# Patient Record
Sex: Male | Born: 1955 | Race: Black or African American | Hispanic: No | Marital: Single | Smoking: Current every day smoker
Health system: Southern US, Community
[De-identification: ages and names within clinical notes are randomized; demographics above are authoritative.]

## PROBLEM LIST (undated history)

## (undated) DIAGNOSIS — E785 Hyperlipidemia, unspecified: Secondary | ICD-10-CM

## (undated) HISTORY — PX: COLONOSCOPY: SHX174

## (undated) HISTORY — PX: HERNIA REPAIR: SHX51

## (undated) HISTORY — DX: Hyperlipidemia, unspecified: E78.5

---

## 2009-06-02 ENCOUNTER — Inpatient Hospital Stay (HOSPITAL_COMMUNITY): Admission: EM | Admit: 2009-06-02 | Discharge: 2009-06-04 | Payer: Self-pay | Admitting: Emergency Medicine

## 2009-07-05 ENCOUNTER — Ambulatory Visit: Payer: Self-pay | Admitting: Internal Medicine

## 2009-07-05 LAB — CONVERTED CEMR LAB
ALT: 16 units/L (ref 0–53)
AST: 25 units/L (ref 0–37)
BUN: 9 mg/dL (ref 6–23)
Basophils Relative: 1 % (ref 0.0–3.0)
Chloride: 107 meq/L (ref 96–112)
Cholesterol: 179 mg/dL (ref 0–200)
Eosinophils Relative: 1 % (ref 0.0–5.0)
HCT: 41.6 % (ref 39.0–52.0)
Hemoglobin, Urine: NEGATIVE
Hemoglobin: 14.1 g/dL (ref 13.0–17.0)
Leukocytes, UA: NEGATIVE
Lymphs Abs: 2.2 10*3/uL (ref 0.7–4.0)
MCV: 93.5 fL (ref 78.0–100.0)
Monocytes Absolute: 0.6 10*3/uL (ref 0.1–1.0)
Neutro Abs: 6.8 10*3/uL (ref 1.4–7.7)
Nitrite: NEGATIVE
Potassium: 4.1 meq/L (ref 3.5–5.1)
RBC: 4.45 M/uL (ref 4.22–5.81)
Sodium: 141 meq/L (ref 135–145)
TSH: 0.65 microintl units/mL (ref 0.35–5.50)
Total Protein, Urine: NEGATIVE mg/dL
Total Protein: 6.9 g/dL (ref 6.0–8.3)
Triglycerides: 63 mg/dL (ref 0.0–149.0)
WBC: 9.8 10*3/uL (ref 4.5–10.5)
pH: 5 (ref 5.0–8.0)

## 2009-07-29 ENCOUNTER — Ambulatory Visit: Payer: Self-pay | Admitting: Gastroenterology

## 2009-08-14 ENCOUNTER — Encounter: Payer: Self-pay | Admitting: Gastroenterology

## 2009-08-14 ENCOUNTER — Ambulatory Visit: Payer: Self-pay | Admitting: Gastroenterology

## 2009-08-16 ENCOUNTER — Encounter: Payer: Self-pay | Admitting: Gastroenterology

## 2011-02-06 IMAGING — CR DG CHEST 1V PORT
1 series · 1 of 1 positions shown · non-contrast
Comparison: None

CLINICAL DATA: Small bowel obstruction, vomiting and incarcerated
inguinal hernia.  Preoperative respiratory exam.

PORTABLE CHEST - 1 VIEW

[view not recorded]
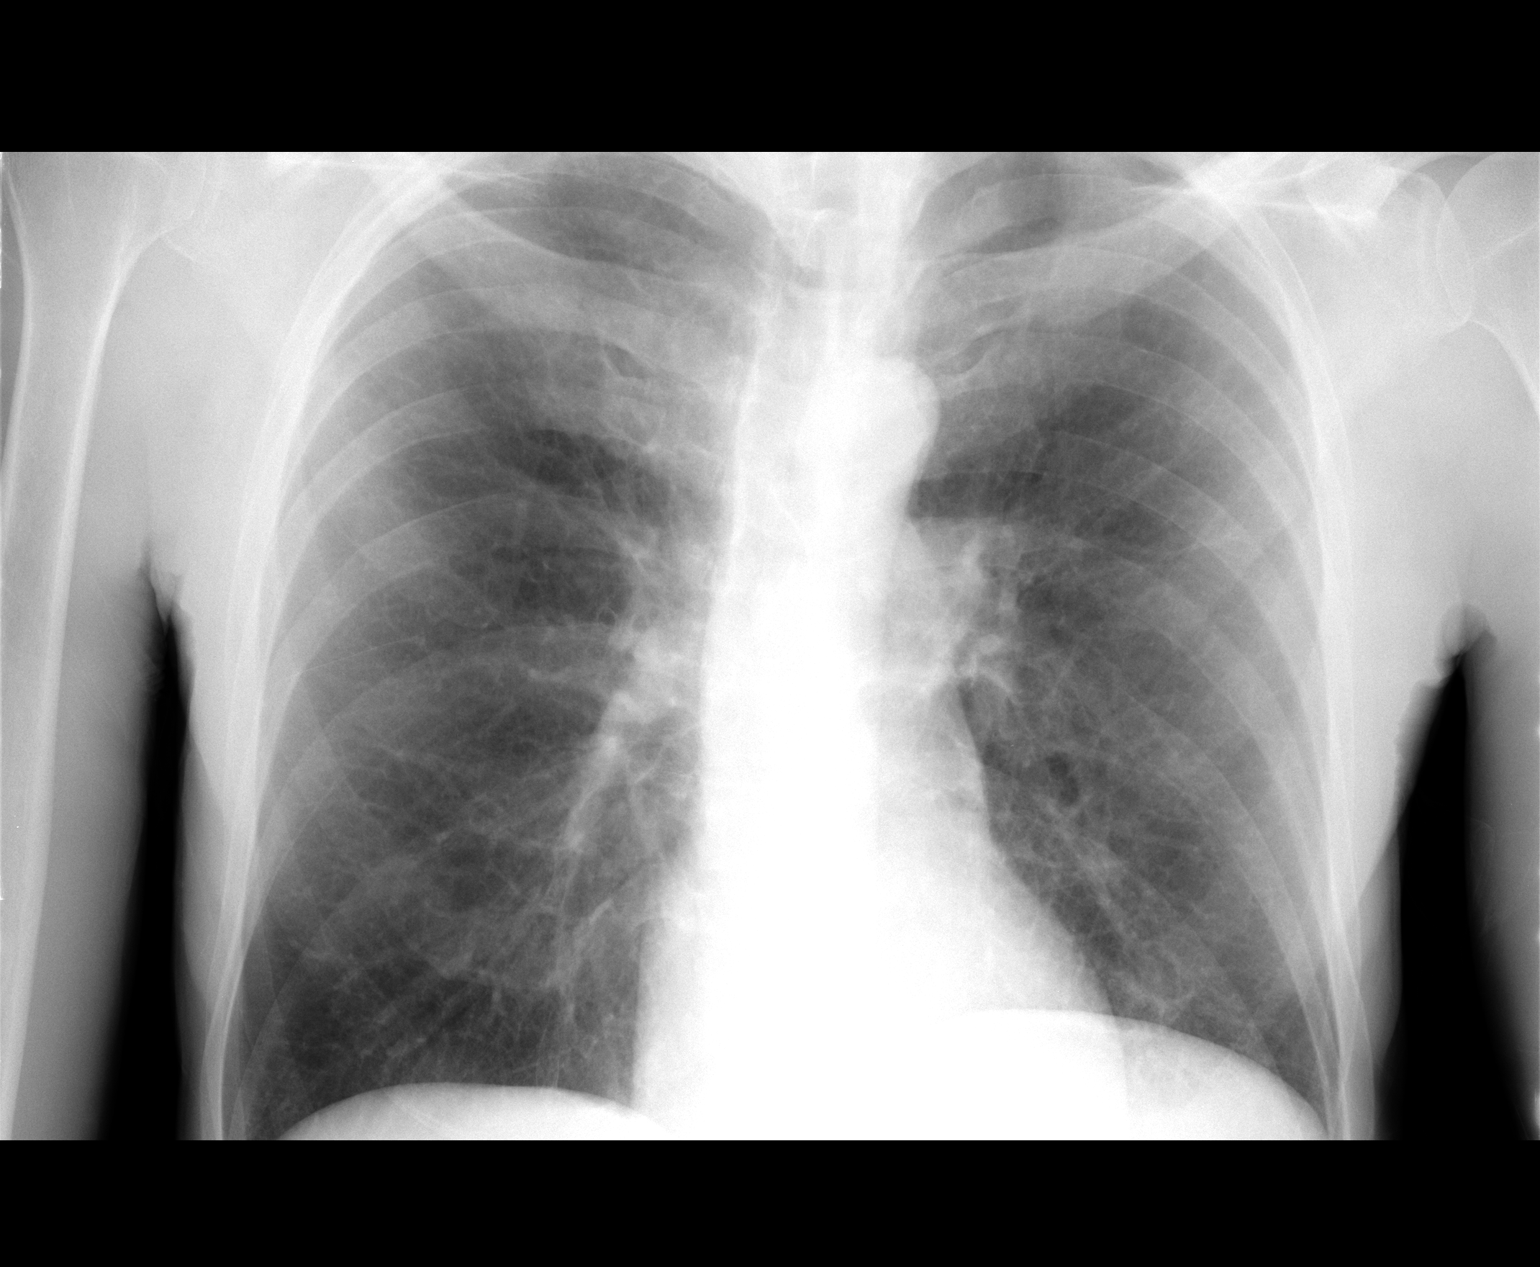

[1 of 1 positions shown; findings below may reference images not displayed]

FINDINGS: Lungs show evidence of COPD.  There is mild elevation of
the left hemidiaphragm.  No edema, infiltrate or pleural fluid
identified.  Heart size is normal.
IMPRESSION: COPD.  No active disease.

## 2011-04-06 LAB — URINALYSIS, ROUTINE W REFLEX MICROSCOPIC
Ketones, ur: >80 mg/dL — AB
Nitrite: NEGATIVE
Specific Gravity, Urine: 1.035 — ABNORMAL HIGH (ref 1.005–1.030)
Urobilinogen, UA: 2 mg/dL — ABNORMAL HIGH (ref 0.0–1.0)
pH: 6.5 (ref 5.0–8.0)

## 2011-04-06 LAB — CBC
HCT: 36.6 % — ABNORMAL LOW (ref 39.0–52.0)
Hemoglobin: 12.4 g/dL — ABNORMAL LOW (ref 13.0–17.0)
Hemoglobin: 15.4 g/dL (ref 13.0–17.0)
MCHC: 32.6 g/dL (ref 30.0–36.0)
MCV: 92.7 fL (ref 78.0–100.0)
Platelets: 218 10*3/uL (ref 150–400)
RDW: 12.7 % (ref 11.5–15.5)
RDW: 12.7 % (ref 11.5–15.5)

## 2011-04-06 LAB — COMPREHENSIVE METABOLIC PANEL
ALT: 25 U/L (ref 0–53)
AST: 36 U/L (ref 0–37)
Albumin: 4.4 g/dL (ref 3.5–5.2)
Alkaline Phosphatase: 69 U/L (ref 39–117)
Calcium: 9.7 mg/dL (ref 8.4–10.5)
GFR calc Af Amer: >60 mL/min (ref >60)
Glucose, Bld: 137 mg/dL — ABNORMAL HIGH (ref 70–99)
Potassium: 3.9 mEq/L (ref 3.5–5.1)
Sodium: 138 mEq/L (ref 135–145)
Total Protein: 8 g/dL (ref 6.0–8.3)

## 2011-04-06 LAB — DIFFERENTIAL
Basophils Relative: 0 % (ref 0–1)
Eosinophils Absolute: 0 10*3/uL (ref 0.0–0.7)
Eosinophils Relative: 0 % (ref 0–5)
Lymphs Abs: 0.8 10*3/uL (ref 0.7–4.0)
Monocytes Absolute: 0.4 10*3/uL (ref 0.1–1.0)
Neutro Abs: 18.2 10*3/uL — ABNORMAL HIGH (ref 1.7–7.7)

## 2011-04-06 LAB — LIPID PANEL
Cholesterol: 148 mg/dL (ref 0–200)
HDL: 39 mg/dL — ABNORMAL LOW (ref >39)
Triglycerides: 34 mg/dL (ref <150)

## 2011-04-06 LAB — LIPASE, BLOOD: Lipase: 12 U/L (ref 11–59)

## 2011-04-06 LAB — TSH: TSH: 0.203 u[IU]/mL — ABNORMAL LOW (ref 0.350–4.500)

## 2011-04-06 LAB — CREATININE, SERUM: GFR calc non Af Amer: >60 mL/min (ref >60)

## 2011-04-06 LAB — POTASSIUM: Potassium: 4.7 mEq/L (ref 3.5–5.1)

## 2011-05-12 NOTE — H&P (Signed)
NAMEABDULKARIM, Charles Bates NO.:  [id number]   MEDICAL RECORD NO.:  [id number]          PATIENT TYPE:  EMS   LOCATION:  ED                           FACILITY:  [facility]   PHYSICIAN:  Ardeth Sportsman, MD     DATE OF BIRTH:  1956/11/14   DATE OF ADMISSION:  06/02/2009  DATE OF DISCHARGE:                              HISTORY & PHYSICAL   REASON FOR CONSULTATION AND ADMISSION:  Small bowel obstruction  secondary to incarcerated right inguinal hernia.   HISTORY OF THE PRESENT ILLNESS:  The patient is a 55 year old smoker who  is otherwise healthy and does not see physicians.  He normally has  pretty good exercise tolerance. He has never had any history of hernias  or prior surgeries that he can recall.  However, yesterday, he started  having some nausea.  It intensified to the point that he vomited.  He  came to the emergency room.  He was found to have a lump in his right  groin that was not particularly large nor particularly tender, however,  workup was done and he has evidence of bowel obstruction by CAT scan  with a transition point in his right inguinal canal with ileum within  it.  Based on concern surgical consultation was requested.  The patient  denies any sick contacts or travel history.  He is obstipated with no  flatus or bowel movement today.  He normally has a bowel movement about  every day or every other day.  He has some minor cough.  There has been  no recent bronchitis.  No issues of significant dysuria.  I think he  does moderate lifting and activity.   PAST MEDICAL HISTORY:  Tobacco abuse.   PAST SURGICAL HISTORY:  Negative.   MEDICATIONS:  None.   ALLERGIES:  None.   SOCIAL HISTORY:  Positive for tobacco but he denies any alcohol or any  other drug use.  He is not married.  His mother is here at his side.   FAMILY HISTORY:  He cannot recall any major GI issues such as colon  cancer or polyps or Meckel's diverticulum.  No history of inflammatory  bowel disease.  No early cardiopulmonary history.   REVIEW OF SYSTEMS:  Review of systems is noted per history of present  illness.  In general he says his weight has been stable but his mother  feels like he has lot a little bit of weight over the past few years.  No fevers, chills or sweats.  Eyes:  Negative.  HEENT:  Negative.  Cardiac and respiratory:  Negative.  He can walk a couple of miles  without much difficulty.  GI:  No dysphagia to solids or liquids.  No  hematochezia.  No hematemesis.  No hematuria.  No heart burn or reflux.  GU:  No dysuria, pyuria, hematuria, no straining with urination.  No  prostatism.  Musculoskeletal, dermatologic, neurologic, psychiatric,  heme/lymph, allergic are otherwise negative.   PHYSICAL EXAMINATION:  VITALS SIGNS:  Temperature is 100.8 as his  temperature maximum.  Pulse in the 70s, respirations 16, blood pressure  115/66.  He had 3/10 abdominal and groin pain.  GENERAL:  On physical examination he is a well-developed, thin, mildly  cachectic male in no acute distress.  PSYCHIATRIC:  He is pleasant and interactive with at least average  intelligence.  No evidence of any dementia, psychosis or paranoia.  HEENT:  Eyes:  Pupils are equal, round, and reactive to light.  His  sclerae are slightly injected although no evidence of any icterus.  Extraocular movements are intact.  NECK:  Neck is supple without any masses.  Trachea is midline.  LYMPH:  No head, neck, axillary, or groin lymphadenopathy.  HEENT:  Normocephalic.  The mucous membranes are dry.  The nasopharynx  and oropharynx are clear.  HEART:  Regular rate and rhythm.  No murmurs, gallops or rubs.  CHEST:  Clear to auscultation bilaterally.  No wheezes, rales or  rhonchi.  No pain to rib or sternal compression.  ABDOMEN:  Abdomen is mildly to moderately distended but very soft.  No  umbilical hernia.  No evidence of incisions.  GENITAL:  Normal external male genitalia.  He has an  obvious 4 x 4-cm  mass in his right groin.  His testicles appear to be normal.  I feel an  impulse on the left side concerning for a spontaneously reducible left  inguinal hernia.  RECTAL:  Deferred per patient request.  MUSCULOSKELETAL:  No evidence of any clubbing, cyanosis or edema.  Normal range of motion in the shoulders, elbows and wrists as well as  hips, knees and ankles.  BREASTS:  No sores or lesions.  No nipple discharge.  No discrete  masses.  BACK:  No pain on costophrenic angles.  No pain on cervical,  thoracolumbar or sacral regions.  SKIN:  No petechiae or purpura.  He does have some moles on his face and  trunk that are unknown and few periangiomas but no evidence of any  telangiectasias or caput medusae.   LABORATORY DATA:  Blood work:  His white count is 15.4.  Urinalysis is  positive for ketones but otherwise is negative.  His electrolytes are  ok.   CT scan shows mildly dilated small bowel loops 3-4-cm in size.  He has a  transition point in his right groin with an obvious peep of small bowel  concerning for an incarcerated right inguinal hernia.  He is  decompressed distally.  He has some stool in his colon.  He has not  evidence of any other incisional hernias.   ASSESSMENT/PLAN:  A 55 year old male with bilateral inguinal hernias,  left spontaneously reducible and right incarcerated with a transition  point for small bowel obstruction.  1. Admit.  2. IV antibiotics.  3. IV fluids.  4. EKG is clear.  5. Chest x-ray.  6. Diagnostic laparoscopy, possible exploratory laparotomy with repair      of incarcerated right inguinal hernia and possible bilateral      inguinal hernia repairs.  If there is no evidence of any ischemia      and it is easily reducible I will do a laparoscopic bilateral      inguinal hernia repair with mesh.  However, if there it is necrotic      and requires bowel resection then I will focus on the right side      only and use biologic  mesh underlie.  The risks, benefits and      alternatives were discussed.  Questions were answered and the  patient understands the risk of surgery is far less than the risk      of doing nothing and he and his mother agreed to proceed.  I have      discussed the case with Dr. Weldon Inches, the ER physician as well as      the operating room staff and they are making efforts to get this      done emergently.   He is also having NG tube currently being placed as well.  We will do a  Foley for monitoring perioperatively as well.      Ardeth Sportsman, MD  Electronically Signed     SCG/MEDQ  D:  06/02/2009  T:  06/03/2009  Job:  [id number]   cc:   Hassan Buckler. Weldon Inches, MD  Fax: [phone]

## 2011-05-12 NOTE — Op Note (Signed)
Charles Bates, Charles Bates NO.:  [id number]   MEDICAL RECORD NO.:  [id number]          PATIENT TYPE:  INP   LOCATION:  0102                         FACILITY:  [facility]   PHYSICIAN:  Ardeth Sportsman, MD     DATE OF BIRTH:  [birth year]   DATE OF PROCEDURE:  06/02/2009  DATE OF DISCHARGE:                               OPERATIVE REPORT   PRIMARY CARE PHYSICIAN:  Not available.   ER PHYSICIAN:  Tinnie Gens P. Weldon Inches, MD at [facility] emergency  department.   SURGEON:  Ardeth Sportsman, MD   ASSISTANT:  RN.   PREOPERATIVE DIAGNOSES:  1. Incarcerated right inguinal hernia.  2. Small bowel obstruction secondary to #1.  3. Question of left inguinal hernia.   POSTOPERATIVE DIAGNOSES:  1. Small bowel obstruction.  2. Right direct inguinal hernia incarcerated with the distal ileum      causing bowel obstruction.  3. Left nonincarcerated femoral hernia.  4. Left nonincarcerated obturator hernia.   PROCEDURE PERFORMED:  1. Diagnostic laparoscopy.  2. Laparoscopic reduction, right incarcerated direct inguinal hernia.  3. Laparoscopic ligation of the right obturator bleeding vein and      arterial branch.  4. Laparoscopic left femoral hernia repair.  5. Laparoscopic left obturator hernia repair.   ANESTHESIA:  1. General anesthesia.  2. Local anesthetic in field block around all port sites.  Right      ilioinguinal/genitofemoral nerve blocks.   DRAINS:  None.   ESTIMATED BLOOD LOSS:  100 mL.   COMPLICATIONS:  Bleeding from left obturator vein and arterial branch  controlled with pressure and laparoscopic clip applier.   INDICATIONS:  Mr. Charles Bates is a 55 year old male who is otherwise healthy  and does not have a regular Dr. who has a very physically active job  doing a lot of lifting and moving.  He noted yesterday he started having  nausea, vomiting and abdominal discomfort.  He also felt a lump in his  right groin.  He denies any history of prior hernias.  When he  started  throwing up his mother brought him into the emergency room.  He had a  workup concerning for possible strain and incarcerated inguinal hernia.  Neither Dr. Weldon Inches nor myself could reduce it.   Differential diagnosis was discussed.  Options were discussed,  recommendations made for diagnostic laparoscopy versus exploratory  laparotomy with reduction and repair of right inguinal hernia.  He has  possible left inguinal hernia as well.  Recommendations made for  inspection on that region with possible repair if it is safe.  Possible  need for bowel resection and or ostomy was discussed.  Natural history  of surgery discussed.  Questions answered and he and his mother agreed  to proceed.   OPERATIVE FINDINGS:  He had a knuckle of distal ileum (about a foot  proximal to the ligament of Treitz) that was incarcerated into a dilated  direct inguinal hernia.  He had no evidence of right indirect femoral or  obturator hernias.  On the left, he had no evidence of a left direct or  indirect hernia.  He had  a mildly dilated left femoral hernia and a  mildly dilated left obturator hernia as well.   DESCRIPTION OF PROCEDURE:  Informed consent was confirmed.  The patient  received IV cefoxitin just prior to surgery.  He had preoperative chest  x-ray and EKG that was otherwise clear.  He underwent general anesthesia  without any difficulty.  He had a Foley catheter sterilely placed.  He  already had a nasogastric tube placed.  He was positioned supine, both  arms tucked.  His abdomen was clipped, prepped and draped in sterile  fashion.  Surgical time out confirmed our plan.   A #5 mm  port was placed in the left upper quadrant using optical entry  technique with the patient in steep reverse Trendelenburg and left side  up.  Camera inspection revealed no intra-abdominal injury.  Under direct  visualization a 5 mm port was placed in the left flank.  Camera  inspection revealed mildly dilated  small bowel loops with a transition  point going into a direct defect.  The right groin was somewhat firm but  after careful massage and milking, was able to pop the ileum back into  the peritoneal cavity.  Laparoscopic inspection revealed some ecchymosis  on the small bowel but no evidence of any ischemia or necrosis.  Irrigation was done with clear return down in the pelvis.  The bowel was  peristalsing well.  After about 10 minutes, on inspection and re-  examination, the bowel had pinked up and looked healthy with a knuckle  serosal bruise.  Camera inspection revealed possible femoral hernia on  the left side.  Camera inspection revealed no other intra-abdominal  pathology.  The appendix looked normal.   With that, capnoperitoneum was evacuated.  I converted to a  preperitoneal approach and a #12 mm Hasson port was placed  infraumbilically using a Hasson technique and the  preperitoneal/retrorectal plane just left of midline.  A 10 mm/30 degree  scope was used to free the peritoneum off bilateral lower quadrants as  capnopreperitoneum was induced to 15 mmHg.  5 mm ports were placed on  the left and right midabdomen.   Dissection was carried onto the right side.  Peritoneum was swept off  the right flank.  The bladder was freed off the anterior medial aspects  off the pelvis.  A fairly large swath of peritoneum was going up into a  direct defect medial to the inferior epigastric vessels.  The hernia sac  was carefully skeletonized with the rest of the cord structures  including vas deferens.  I was able to reduce the hernia sac.  It looked  a little bruised but viable.  Hernia sac and peritoneum were peeled off  the cord structures as proximally as possible.  Inspection revealed no  evidence of femoral indirect or obturator defects.   Attention was turned towards the left side.  Dissection was carried out  in a mirror image fashion.  There was no evidence of any direct or  indirect  hernia defect; however, in further dissection I could see a  mildly dilated femoral ring concerning for a femoral hernia.  I  dissected in the obturator area and in freeing that off and sweeping the  obturator vein, I encountered some brisk bleeding.  I ended up placing  another 5 mm port in the right midabdomen.  After copious irrigation and  pressure, I saw that there was a tear in the obturator vein.  It was  carefully isolated  and in lifting it, it actually avulsed.  I placed two  clips on the proximal side of the obturator vein and two clips more on  the distal side.  I had to place a few more clips.  Ultimately I got  hemostasis there.  On further examination there was a branch of the  obturator artery that was oozing as well.  This was closed with clips  proximally and distally to good result.  Copious irrigation was done and  hemostasis was ensured.  This was the source of 95% of all the bleeding  during the case.  The peritoneum was peeled back proximally off the  psoas muscle as well.  Camera inspection revealed no injury to any other  vessels.  I actually could see the left ureter and it was normal and  fine.   15 x 15 cm ultra light weight polypropylene mesh was used for each side  since there was no evidence of any ischemia necrosis and he was  hemodynamically stable and there was not contamination.  Each piece of  mesh was cut to a half skull shape.  A slit was made for this half skull  shape about 6 cm/9 cm point and carried up.  Mesh was rolled up, placed  into the right preperitoneal space and position so that a medial  inferior flap that was 6 x 6 cm rested in the true pelvis between the  bladder covering up the obturator foramen well and rolling up, coming up  over the right superior pubic ramus as well.  The mesh lay well  posteriorly over the psoas region covering obturator, femoral, direct  and indirect regions.   A similar mesh was placed in the mirror image  fashion on the left side.  It covered the left obturator and femoral defects well.  It covered the  direct and indirect, the internal ring and the direct spaces well.   Copious irrigation done with clear return and there was no evidence of  any bleeding or other abnormalities.  However, there was some small tear  in the left lower quadrant peritoneum and right midabdominal peritoneum  and these  were easily closed.  I went ahead and did a high ligation on  the very dilated hernia sac on the right direct side since there was a  tear near the base as well to good result.  Mesh was held down in place  as capnopreperitoneum was evacuated.  Ports were removed.  The  infraumbilical fascial defect was closed using the 0 Vicryl interrupted  stitch x2.  Skin was closed using 4-0 Monocryl stitch.  Sterile dressing  was applied.   Patient was extubated and sent to recovery room in stable condition.  I  have discussed postoperative care with the patient's mother and friend  in detail.  Gave her my card.  Questions were answered and they  expressed understanding and appreciation.      Ardeth Sportsman, MD  Electronically Signed     SCG/MEDQ  D:  06/02/2009  T:  06/03/2009  Job:  [id number]   cc:   Hassan Buckler. Weldon Inches, MD  Fax: [phone]

## 2014-07-27 ENCOUNTER — Encounter: Payer: Self-pay | Admitting: Gastroenterology

## 2014-08-18 ENCOUNTER — Encounter: Payer: Self-pay | Admitting: Gastroenterology

## 2014-09-24 ENCOUNTER — Inpatient Hospital Stay (HOSPITAL_COMMUNITY)
Admission: EM | Admit: 2014-09-24 | Discharge: 2014-09-26 | DRG: 307 | Disposition: A | Discharge status: Home / Self Care | Payer: BC Managed Care – PPO | Attending: Internal Medicine | Admitting: Internal Medicine

## 2014-09-24 ENCOUNTER — Encounter (HOSPITAL_COMMUNITY): Payer: Self-pay | Admitting: Emergency Medicine

## 2014-09-24 DIAGNOSIS — K5900 Constipation, unspecified: Secondary | ICD-10-CM | POA: Diagnosis present

## 2014-09-24 DIAGNOSIS — IMO0002 Reserved for concepts with insufficient information to code with codable children: Secondary | ICD-10-CM | POA: Diagnosis present

## 2014-09-24 DIAGNOSIS — M545 Low back pain: Secondary | ICD-10-CM

## 2014-09-24 DIAGNOSIS — M549 Dorsalgia, unspecified: Secondary | ICD-10-CM | POA: Diagnosis not present

## 2014-09-24 DIAGNOSIS — A5202 Syphilitic aortitis: Principal | ICD-10-CM | POA: Diagnosis present

## 2014-09-24 DIAGNOSIS — F17200 Nicotine dependence, unspecified, uncomplicated: Secondary | ICD-10-CM | POA: Diagnosis present

## 2014-09-24 DIAGNOSIS — I776 Arteritis, unspecified: Secondary | ICD-10-CM

## 2014-09-24 DIAGNOSIS — R935 Abnormal findings on diagnostic imaging of other abdominal regions, including retroperitoneum: Secondary | ICD-10-CM

## 2014-09-24 DIAGNOSIS — R109 Unspecified abdominal pain: Secondary | ICD-10-CM | POA: Diagnosis present

## 2014-09-24 LAB — COMPREHENSIVE METABOLIC PANEL
ALBUMIN: 3.5 g/dL (ref 3.5–5.2)
ALK PHOS: 99 U/L (ref 39–117)
ALT: 11 U/L (ref 0–53)
AST: 16 U/L (ref 0–37)
Anion gap: 11 (ref 5–15)
BUN: 14 mg/dL (ref 6–23)
CALCIUM: 9.4 mg/dL (ref 8.4–10.5)
CO2: 26 mEq/L (ref 19–32)
Chloride: 101 mEq/L (ref 96–112)
Creatinine, Ser: 0.7 mg/dL (ref 0.50–1.35)
GFR calc Af Amer: >90 mL/min (ref >90)
GFR calc non Af Amer: >90 mL/min (ref >90)
Glucose, Bld: 108 mg/dL — ABNORMAL HIGH (ref 70–99)
POTASSIUM: 4.6 meq/L (ref 3.7–5.3)
SODIUM: 138 meq/L (ref 137–147)
TOTAL PROTEIN: 7.6 g/dL (ref 6.0–8.3)
Total Bilirubin: 0.3 mg/dL (ref 0.3–1.2)

## 2014-09-24 LAB — URINALYSIS, ROUTINE W REFLEX MICROSCOPIC
BILIRUBIN URINE: NEGATIVE
Glucose, UA: NEGATIVE mg/dL
Hgb urine dipstick: NEGATIVE
Ketones, ur: NEGATIVE mg/dL
Leukocytes, UA: NEGATIVE
NITRITE: NEGATIVE
PROTEIN: NEGATIVE mg/dL
SPECIFIC GRAVITY, URINE: 1.023 (ref 1.005–1.030)
UROBILINOGEN UA: 1 mg/dL (ref 0.0–1.0)
pH: 5.5 (ref 5.0–8.0)

## 2014-09-24 LAB — CBC WITH DIFFERENTIAL/PLATELET
BASOS ABS: 0 10*3/uL (ref 0.0–0.1)
BASOS PCT: 0 % (ref 0–1)
EOS ABS: 0.1 10*3/uL (ref 0.0–0.7)
Eosinophils Relative: 1 % (ref 0–5)
HCT: 41.5 % (ref 39.0–52.0)
Hemoglobin: 14.2 g/dL (ref 13.0–17.0)
LYMPHS ABS: 2.2 10*3/uL (ref 0.7–4.0)
Lymphocytes Relative: 20 % (ref 12–46)
MCH: 30.1 pg (ref 26.0–34.0)
MCHC: 34.2 g/dL (ref 30.0–36.0)
MCV: 88.1 fL (ref 78.0–100.0)
Monocytes Absolute: 0.8 10*3/uL (ref 0.1–1.0)
Monocytes Relative: 7 % (ref 3–12)
NEUTROS PCT: 72 % (ref 43–77)
Neutro Abs: 8.1 10*3/uL — ABNORMAL HIGH (ref 1.7–7.7)
PLATELETS: 334 10*3/uL (ref 150–400)
RBC: 4.71 MIL/uL (ref 4.22–5.81)
RDW: 12.5 % (ref 11.5–15.5)
WBC: 11.3 10*3/uL — ABNORMAL HIGH (ref 4.0–10.5)

## 2014-09-24 MED ORDER — OXYCODONE-ACETAMINOPHEN 5-325 MG PO TABS: 2.0000 | ORAL_TABLET | Freq: Once | ORAL | Status: DC

## 2014-09-24 MED ORDER — KETOROLAC TROMETHAMINE 60 MG/2ML IM SOLN: 60.0000 mg | Freq: Once | INTRAMUSCULAR | Status: DC

## 2014-09-24 NOTE — ED Notes (Signed)
Pt c/o left flank pain for about 3 weeks. Pt states that now has left groin pain for about 2 weeks. 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

## 2014-09-25 ENCOUNTER — Encounter (HOSPITAL_COMMUNITY): Payer: Self-pay

## 2014-09-25 ENCOUNTER — Emergency Department (HOSPITAL_COMMUNITY): Payer: BC Managed Care – PPO

## 2014-09-25 DIAGNOSIS — I776 Arteritis, unspecified: Secondary | ICD-10-CM

## 2014-09-25 DIAGNOSIS — R109 Unspecified abdominal pain: Secondary | ICD-10-CM | POA: Diagnosis present

## 2014-09-25 DIAGNOSIS — M545 Low back pain, unspecified: Secondary | ICD-10-CM

## 2014-09-25 DIAGNOSIS — K5900 Constipation, unspecified: Secondary | ICD-10-CM | POA: Diagnosis present

## 2014-09-25 DIAGNOSIS — R1084 Generalized abdominal pain: Secondary | ICD-10-CM

## 2014-09-25 DIAGNOSIS — M549 Dorsalgia, unspecified: Secondary | ICD-10-CM

## 2014-09-25 DIAGNOSIS — R935 Abnormal findings on diagnostic imaging of other abdominal regions, including retroperitoneum: Secondary | ICD-10-CM

## 2014-09-25 DIAGNOSIS — IMO0002 Reserved for concepts with insufficient information to code with codable children: Secondary | ICD-10-CM | POA: Diagnosis present

## 2014-09-25 DIAGNOSIS — F17200 Nicotine dependence, unspecified, uncomplicated: Secondary | ICD-10-CM | POA: Diagnosis present

## 2014-09-25 DIAGNOSIS — A5202 Syphilitic aortitis: Secondary | ICD-10-CM | POA: Diagnosis present

## 2014-09-25 LAB — SEDIMENTATION RATE: SED RATE: 20 mm/h — AB (ref 0–16)

## 2014-09-25 LAB — HIV ANTIBODY (ROUTINE TESTING W REFLEX): HIV: NONREACTIVE

## 2014-09-25 LAB — C-REACTIVE PROTEIN: CRP: 0.7 mg/dL — AB (ref <0.60)

## 2014-09-25 LAB — RPR

## 2014-09-25 MED ORDER — IOHEXOL 300 MG/ML  SOLN
100.0000 mL | Freq: Once | INTRAMUSCULAR | Status: AC | PRN
  Administered 2014-09-25: 100 mL via INTRAVENOUS

## 2014-09-25 MED ORDER — MORPHINE SULFATE 4 MG/ML IJ SOLN
4.0000 mg | INTRAMUSCULAR | Status: DC | PRN
  Administered 2014-09-25: 4 mg via INTRAVENOUS
  Filled 2014-09-25 (×2): qty 1

## 2014-09-25 MED ORDER — HEPARIN SODIUM (PORCINE) 5000 UNIT/ML IJ SOLN
5000.0000 [IU] | Freq: Three times a day (TID) | INTRAMUSCULAR | Status: DC
  Administered 2014-09-25 – 2014-09-26 (×4): 5000 [IU] via SUBCUTANEOUS
  Filled 2014-09-25 (×10): qty 1

## 2014-09-25 MED ORDER — KETOROLAC TROMETHAMINE 30 MG/ML IJ SOLN
30.0000 mg | Freq: Once | INTRAMUSCULAR | Status: AC
  Administered 2014-09-25: 30 mg via INTRAVENOUS
  Filled 2014-09-25: qty 1

## 2014-09-25 MED ORDER — DOCUSATE SODIUM 100 MG PO CAPS
100.0000 mg | ORAL_CAPSULE | Freq: Two times a day (BID) | ORAL | Status: DC
  Administered 2014-09-25 – 2014-09-26 (×3): 100 mg via ORAL
  Filled 2014-09-25 (×2): qty 1

## 2014-09-25 MED ORDER — IOHEXOL 300 MG/ML  SOLN
50.0000 mL | Freq: Once | INTRAMUSCULAR | Status: AC | PRN
  Administered 2014-09-25: 50 mL via ORAL

## 2014-09-25 MED ORDER — POLYETHYLENE GLYCOL 3350 17 G PO PACK: 17.0000 g | PACK | Freq: Every day | ORAL | Status: DC | PRN

## 2014-09-25 MED ORDER — MORPHINE SULFATE 4 MG/ML IJ SOLN
4.0000 mg | Freq: Once | INTRAMUSCULAR | Status: AC
  Administered 2014-09-25: 4 mg via INTRAVENOUS
  Filled 2014-09-25: qty 1

## 2014-09-25 MED ORDER — ACETAMINOPHEN 500 MG PO TABS
500.0000 mg | ORAL_TABLET | Freq: Four times a day (QID) | ORAL | Status: DC | PRN
  Administered 2014-09-25 – 2014-09-26 (×2): 500 mg via ORAL
  Filled 2014-09-25 (×2): qty 1

## 2014-09-25 MED ORDER — BISACODYL 10 MG RE SUPP
10.0000 mg | Freq: Once | RECTAL | Status: AC
  Administered 2014-09-25: 10 mg via RECTAL
  Filled 2014-09-25: qty 1

## 2014-09-25 MED ORDER — NAPROXEN 500 MG PO TABS
500.0000 mg | ORAL_TABLET | Freq: Two times a day (BID) | ORAL | Status: DC
  Administered 2014-09-25 – 2014-09-26 (×3): 500 mg via ORAL
  Filled 2014-09-25: qty 2
  Filled 2014-09-25: qty 1
  Filled 2014-09-25: qty 2
  Filled 2014-09-25 (×4): qty 1
  Filled 2014-09-25: qty 2

## 2014-09-25 MED ORDER — NAPROXEN SODIUM 275 MG PO TABS
440.0000 mg | ORAL_TABLET | Freq: Two times a day (BID) | ORAL | Status: DC
Start: 2014-09-25 — End: 2014-09-25
  Filled 2014-09-25 (×3): qty 2

## 2014-09-25 NOTE — H&P (Signed)
[facility] History and Physical  Charles Bates JQZ:[id number] DOB: 10/22/1956 DOA: 09/24/2014  Referring physician: EDP PCP: Cathlean Cower, MD   Chief Complaint: Groin, Flank, Back pain   HPI: Charles Bates is a 58 y.o. male who presents to the ED with worsening low back pain, left groin and left sided abdominal pain for the last 3 weeks.  Pain has been worsening, pain is worse with any sort of movement even getting out of his chair causes discomfort and pain.  Has had some constipation, no fevers, no chills, no weight loss.  No recent injury or trauma.  CT abd/pelvis was performed in ED looking for a cause of his pain and appears to demonstrate aortitis.  Review of Systems: Systems reviewed.  As above, otherwise negative  History reviewed. No pertinent past medical history. Past Surgical History  Procedure Laterality Date  . Hernia repair     Social History:  reports that he has been smoking Cigarettes.  He has been smoking about 0.00 packs per day. He does not have any smokeless tobacco history on file. He reports that he does not drink alcohol. His drug history is not on file.  No Known Allergies  No family history on file.   Prior to Admission medications   Medication Sig Start Date End Date Taking? Authorizing Provider  acetaminophen (TYLENOL) 500 MG tablet Take 500 mg by mouth every 6 (six) hours as needed for mild pain.   Yes Historical Provider, MD  Menthol, Topical Analgesic, (BENGAY EX) Apply 1 application topically daily.   Yes Historical Provider, MD  naproxen sodium (ANAPROX) 220 MG tablet Take 440 mg by mouth 2 (two) times daily with a meal.   Yes Historical Provider, MD   Physical Exam: Filed Vitals:   09/25/14 0041  BP: 113/57  Pulse: 70  Temp:   Resp: 18    BP 113/57  Pulse 70  Temp(Src) 97.6 F (36.4 C) (Oral)  Resp 18  SpO2 99%  General Appearance:    Alert, oriented, no distress, appears stated age  Head:    Normocephalic, atraumatic   Eyes:    PERRL, EOMI, sclera non-icteric        Nose:   Nares without drainage or epistaxis. Mucosa, turbinates normal  Throat:   Moist mucous membranes. Oropharynx without erythema or exudate.  Neck:   Supple. No carotid bruits.  No thyromegaly.  No lymphadenopathy.   Back:     No CVA tenderness, no spinal tenderness  Lungs:     Clear to auscultation bilaterally, without wheezes, rhonchi or rales  Chest wall:    No tenderness to palpitation  Heart:    Regular rate and rhythm without murmurs, gallops, rubs  Abdomen:     Soft, non-tender, nondistended, normal bowel sounds, no organomegaly  Genitalia:    deferred  Rectal:    deferred  Extremities:   No clubbing, cyanosis or edema.  Pulses:   2+ and symmetric all extremities  Skin:   Skin color, texture, turgor normal, no rashes or lesions  Lymph nodes:   Cervical, supraclavicular, and axillary nodes normal  Neurologic:   CNII-XII intact. Normal strength, sensation and reflexes      throughout    Labs on Admission:  Basic Metabolic Panel:  Recent Labs Lab 09/24/14 1911  NA 138  K 4.6  CL 101  CO2 26  GLUCOSE 108*  BUN 14  CREATININE 0.70  CALCIUM 9.4   Liver Function Tests:  Recent Labs Lab 09/24/14 1911  AST 16  ALT 11  ALKPHOS 99  BILITOT 0.3  PROT 7.6  ALBUMIN 3.5   No results found for this basename: LIPASE, AMYLASE,  in the last 168 hours No results found for this basename: AMMONIA,  in the last 168 hours CBC:  Recent Labs Lab 09/24/14 1911  WBC 11.3*  NEUTROABS 8.1*  HGB 14.2  HCT 41.5  MCV 88.1  PLT 334   Cardiac Enzymes: No results found for this basename: CKTOTAL, CKMB, CKMBINDEX, TROPONINI,  in the last 168 hours  BNP (last 3 results) No results found for this basename: PROBNP,  in the last 8760 hours CBG: No results found for this basename: GLUCAP,  in the last 168 hours  Radiological Exams on Admission: Ct Abdomen Pelvis W Contrast  09/25/2014   CLINICAL DATA:  Left lower quadrant  abdominal pain, groin pain, and lumbar pain.  EXAM: CT ABDOMEN AND PELVIS WITH CONTRAST  TECHNIQUE: Multidetector CT imaging of the abdomen and pelvis was performed using the standard protocol following bolus administration of intravenous contrast.  CONTRAST:  120m OMNIPAQUE IOHEXOL 300 MG/ML  SOLN  COMPARISON:  06/02/2009  FINDINGS: Diffuse emphysematous changes and scattered fibrosis in the lungs. The liver, spleen, gallbladder, pancreas, adrenal glands, kidneys, inferior vena cava, and retroperitoneal lymph nodes are unremarkable. There is calcification of the abdominal aorta with normal caliber. No evidence of dissection. There is increased tissue density surrounding the abdominal aorta suggesting inflammatory process, possibly aortitis. Stomach and small bowel are decompressed. Contrast material does flow through to the colon without evidence of bowel obstruction. Diffusely stool-filled colon may correspond with clinical constipation. No free air or free fluid in the abdomen.  Pelvis: Surgical clips in the left pelvis. Prostate gland is not enlarged. Bladder is decompressed. No free or loculated pelvic fluid collections. Appendix is not identified but no specific inflammatory changes are suggested. No evidence of diverticulitis. No destructive bone lesions.  IMPRESSION: Increased density surrounding the abdominal aorta without evidence of aneurysm or dissection. This may represent inflammatory process such as aortitis. Diffusely stool-filled colon suggesting constipation. Emphysematous changes and fibrosis in the lung bases.   Electronically Signed   By: WLucienne CapersM.D.   On: 09/25/2014 01:46    EKG: Independently reviewed.  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 DDx of cause includes: 1. GCA - although he dosent have other symptoms of this 2. Takayasu - would be very unusual in a 58yo M 3. Syphilis arteritis - a distinct  possibility 4. Infectious aortitis 5. Other undiagnosed rheumatic condition (SLE, RA, Ankylosing spondylitis, can all cause this although rarely) 2. CRP, ESR, RPR, HIV, BCx, are all ordered, also ordering FTA just in case his RPR is a false negative due to a prozone reaction (syphilis is higher on the differential as discussed above and missing this diagnosis would likely be fatal in this patient). 3. Morphine PRN pain 4. Dr. EDonnetta Hutchingconsulted and plans on seeing patient in consult.    Code Status: Full Code  Family Communication: No family in room Disposition Plan: Admit to inpatient   Time spent: 70 min  GARDNER, JARED M. [facility] Pager [phone] If 7AM-7PM, please contact the day team taking care of the patient [email or web address] Password [email or web address]9/29/2015, 2:46 AM

## 2014-09-25 NOTE — Progress Notes (Signed)
Patient seen and examined, admitted by Dr. Alcario Drought this morning.  Briefly 58 year old male presented with worsening low back pain, left groin pain and left-sided abdominal pain for the last 3 weeks. No fevers, chills, no nausea vomiting or diarrhea.  CT abdomen and pelvis showed increased intensity surrounding the abdominal aorta without evidence of aneurysm or dissection, may represent inflammatory process such as aortitis. Diffuse stool in the colon.  BP 129/65  Pulse 67  Temp(Src) 97.5 F (36.4 C) (Oral)  Resp 16  SpO2 99%  A/p  Abdominal pain ? Aortitis, constipation - Agree with assessment and plan per H&P, will follow vasculitis workup, blood cultures - Vascular surgery consulted, Dr. Donnetta Hutching to see - pain control, does he need to be on steroids? Will defer to Vasc surgery.   Constipation: - place on bowel regimen   Kailei Cowens M.D. [facility] 09/25/2014, 10:17 AM  Pager: [phone]

## 2014-09-25 NOTE — ED Provider Notes (Signed)
CSN: [id number]     Arrival date & time 09/24/14  1224 History   First MD Initiated Contact with Patient 09/24/14 2326     Chief Complaint  Patient presents with  . Groin Pain  . Flank Pain      The history is provided by the patient.   Patient presents with worsening low back pain as well as left groin and left-sided abdominal pain over the past 3 weeks.  He states this has caused increasing pain.  He states simple movements of the evening getting out of his chart causes discomfort and pain.  Denies fevers and chills.  No weight loss.  Denies nausea vomiting or diarrhea.  He has had some constipation.  No recent injury or trauma.   History reviewed. No pertinent past medical history. Past Surgical History  Procedure Laterality Date  . Hernia repair     No family history on file. History  Substance Use Topics  . Smoking status: Current Every Day Smoker    Types: Cigarettes  . Smokeless tobacco: Not on file  . Alcohol Use: No    Review of Systems  Genitourinary: Positive for flank pain.  All other systems reviewed and are negative.     Allergies  Review of patient's allergies indicates no known allergies.  Home Medications   Prior to Admission medications   Medication Sig Start Date End Date Taking? Authorizing Provider  acetaminophen (TYLENOL) 500 MG tablet Take 500 mg by mouth every 6 (six) hours as needed for mild pain.   Yes Historical Provider, MD  Menthol, Topical Analgesic, (BENGAY EX) Apply 1 application topically daily.   Yes Historical Provider, MD  naproxen sodium (ANAPROX) 220 MG tablet Take 440 mg by mouth 2 (two) times daily with a meal.   Yes Historical Provider, MD   BP 113/57  Pulse 70  Temp(Src) 97.6 F (36.4 C) (Oral)  Resp 18  SpO2 99% Physical Exam  Nursing note and vitals reviewed. Constitutional: He is oriented to person, place, and time. He appears well-developed and well-nourished.  HENT:  Head: Normocephalic and atraumatic.  Eyes: EOM  are normal.  Neck: Normal range of motion.  Cardiovascular: Normal rate, regular rhythm, normal heart sounds and intact distal pulses.   Pulmonary/Chest: Effort normal and breath sounds normal. No respiratory distress.  Abdominal: Soft. He exhibits no distension. There is no tenderness.  Musculoskeletal: Normal range of motion.  Neurological: He is alert and oriented to person, place, and time.  Skin: Skin is warm and dry.  Psychiatric: He has a normal mood and affect. Judgment normal.    ED Course  Procedures (including critical care time) Labs Review Labs Reviewed  CBC WITH DIFFERENTIAL - Abnormal; Notable for the following:    WBC 11.3 (*)    Neutro Abs 8.1 (*)    All other components within normal limits  COMPREHENSIVE METABOLIC PANEL - Abnormal; Notable for the following:    Glucose, Bld 108 (*)    All other components within normal limits  URINALYSIS, ROUTINE W REFLEX MICROSCOPIC  SEDIMENTATION RATE  C-REACTIVE PROTEIN    Imaging Review Ct Abdomen Pelvis W Contrast  09/25/2014   CLINICAL DATA:  Left lower quadrant abdominal pain, groin pain, and lumbar pain.  EXAM: CT ABDOMEN AND PELVIS WITH CONTRAST  TECHNIQUE: Multidetector CT imaging of the abdomen and pelvis was performed using the standard protocol following bolus administration of intravenous contrast.  CONTRAST:  152mL OMNIPAQUE IOHEXOL 300 MG/ML  SOLN  COMPARISON:  06/02/2009  FINDINGS: Diffuse emphysematous changes and scattered fibrosis in the lungs. The liver, spleen, gallbladder, pancreas, adrenal glands, kidneys, inferior vena cava, and retroperitoneal lymph nodes are unremarkable. There is calcification of the abdominal aorta with normal caliber. No evidence of dissection. There is increased tissue density surrounding the abdominal aorta suggesting inflammatory process, possibly aortitis. Stomach and small bowel are decompressed. Contrast material does flow through to the colon without evidence of bowel obstruction.  Diffusely stool-filled colon may correspond with clinical constipation. No free air or free fluid in the abdomen.  Pelvis: Surgical clips in the left pelvis. Prostate gland is not enlarged. Bladder is decompressed. No free or loculated pelvic fluid collections. Appendix is not identified but no specific inflammatory changes are suggested. No evidence of diverticulitis. No destructive bone lesions.  IMPRESSION: Increased density surrounding the abdominal aorta without evidence of aneurysm or dissection. This may represent inflammatory process such as aortitis. Diffusely stool-filled colon suggesting constipation. Emphysematous changes and fibrosis in the lung bases.   Electronically Signed   By: Lucienne Capers M.D.   On: 09/25/2014 01:46  I personally reviewed the imaging tests through PACS system I reviewed available ER/hospitalization records through the EMR    EKG Interpretation None      MDM   Final diagnoses:  None    Abnormal thickening around his distal aorta.  This is concerning for possible aortitis.  No dissection or aneurysm seen.  I discussed the case with vascular surgery, Dr. Donnetta Hutching, who will see the patient in consultation in the morning.  Admit to hospitalist service.  Patient benefit from being transferred and admitted to [place], MD 09/25/14 [id number]

## 2014-09-25 NOTE — Consult Note (Signed)
[facility] [place]  Reason for Consult:  Abdominal Referring Physician:  Alcario Drought MRN #:  [id number]  History of Present Illness: This is a 58 y.o. male with worsening abdominal and back pain for the past 3 weeks. He also complains of left groin pain that started 3 weeks ago. Movement causes his pain to worsen. He has been taking aleve for pain relief. He denies having similar symptoms in the past. CT abdomen/pelvis performed in the ED revealed possible aortitis.   On ROS, he denies chest pain, dyspnea, fever, chills, dysuria, nausea, vomiting, diarrhea,  weakness of extremities, rest pain, intermittent claudication and non healing wounds of the feet. All other systems negative.   He has no cardiac history. He does not have hypertension, diabetes or hyperlipidemia.  For social history he is a 1 ppd smoker for the past 20 years.   History reviewed. No pertinent past medical history. Past Surgical History  Procedure Laterality Date  . Hernia repair      No Known Allergies  Prior to Admission medications   Medication Sig Start Date End Date Taking? Authorizing Provider  acetaminophen (TYLENOL) 500 MG tablet Take 500 mg by mouth every 6 (six) hours as needed for mild pain.   Yes Historical Provider, MD  Menthol, Topical Analgesic, (BENGAY EX) Apply 1 application topically daily.   Yes Historical Provider, MD  naproxen sodium (ANAPROX) 220 MG tablet Take 440 mg by mouth 2 (two) times daily with a meal.   Yes Historical Provider, MD    History   Social History  . Marital Status: Single    Spouse Name: N/A    Number of Children: N/A  . Years of Education: N/A   Occupational History  . Not on file.   Social History Main Topics  . Smoking status: Current Every Day Smoker    Types: Cigarettes  . Smokeless tobacco: Not on file  . Alcohol Use: No  . Drug Use: Not on file  . Sexual Activity: Not on file   Other Topics Concern  . Not on file   Social  History Narrative  . No narrative on file    History reviewed. No pertinent family history.  Physical Examination  Filed Vitals:   09/25/14 0352  BP: 129/65  Pulse: 67  Temp: 97.5 F (36.4 C)  Resp: 16   There is no weight on file to calculate BMI.  General:  WDWN in NAD Gait: Not observed HENT: WNL, normocephalic Eyes: Pupils equal Pulmonary: normal non-labored breathing, without Rales, rhonchi,  wheezing Cardiac: regular, without  Murmurs, rubs or gallops; without carotid bruits Abdomen: soft, no tenderness, no masses, right flank pain.  Skin: without rashes, without ulcers  Vascular Exam/Pulses:  Right Left  Femoral 2+ (normal) 2+ (normal)  Popliteal 1+ (weak) 1+ (weak)  DP 1+ (weak) Non palpable  PT Non palpable Non palpable   Extremities: without ischemic changes, without Gangrene , without cellulitis; without open wounds;  Musculoskeletal: no muscle wasting or atrophy  Neurologic: A&O X 3; Appropriate Affect ; SENSATION: normal; MOTOR FUNCTION:  moving all extremities equally. Speech is fluent/normal  CBC    Component Value Date/Time   WBC 11.3* 09/24/2014 1911   RBC 4.71 09/24/2014 1911   HGB 14.2 09/24/2014 1911   HCT 41.5 09/24/2014 1911   PLT 334 09/24/2014 1911   MCV 88.1 09/24/2014 1911   MCH 30.1 09/24/2014 1911   MCHC 34.2 09/24/2014 1911   RDW 12.5 09/24/2014 1911   LYMPHSABS  2.2 09/24/2014 1911   MONOABS 0.8 09/24/2014 1911   EOSABS 0.1 09/24/2014 1911   BASOSABS 0.0 09/24/2014 1911    BMET    Component Value Date/Time   NA 138 09/24/2014 1911   K 4.6 09/24/2014 1911   CL 101 09/24/2014 1911   CO2 26 09/24/2014 1911   GLUCOSE 108* 09/24/2014 1911   BUN 14 09/24/2014 1911   CREATININE 0.70 09/24/2014 1911   CALCIUM 9.4 09/24/2014 1911   GFRNONAA >90 09/24/2014 1911   GFRAA >90 09/24/2014 1911    COAGS: No results found for this basename: INR, PROTIME    ASSESSMENT/PLAN: This is a 58 y.o. male with worsening abdominal pain and back pain x 3 weeks. CT  abdomen/pelvis performed in ED revealed inflammation around infrarenal aorta to aortic bifurcation. Possible aortitis versus infectious process versus rheumatic process. No evidence of dissection. He will not require any surgical intervention at this time. Workup per medical team. He will need a repeat CT scan of the abdomen and pelvis in three months for follow up.    Virgina Jock, PA-C [facility] Office: [phone] Pager: [phone]  I have examined the patient, reviewed and agree with above. The patient was seen earlier this morning but unfortunately Epic has been down the majority of the day making documentation difficult. I did review his CT scan and discussed at length with the patient this morning. He does not have any evidence of aneurysm. He does have scattered calcification of his infrarenal aorta. The area between his renal arteries and bifurcation are noted for a thick rind of inflammatory response around his aorta. I explained there is no role for surgical treatment. This could represent infectious etiology or inflammatory disease which is being worked up by the hospitalist. Suspect that his symptoms are related to this but they are nonspecific back pain extending into his groin on left. She specifically has no tenderness over his aorta. He is quite thin. Will need repeat CT scan in 3 months to rule out any change of this. If his rheumatologic workup is negative would treat him symptomatically. No evidence of ongoing infection currently. We'll see again in the morning.  Lanice Folden, MD 09/25/2014 3:05 PM

## 2014-09-26 ENCOUNTER — Other Ambulatory Visit: Payer: Self-pay | Admitting: *Deleted

## 2014-09-26 DIAGNOSIS — I77811 Abdominal aortic ectasia: Secondary | ICD-10-CM

## 2014-09-26 LAB — BASIC METABOLIC PANEL
Anion gap: 10 (ref 5–15)
BUN: 15 mg/dL (ref 6–23)
CALCIUM: 8.8 mg/dL (ref 8.4–10.5)
CO2: 26 mEq/L (ref 19–32)
Chloride: 102 mEq/L (ref 96–112)
Creatinine, Ser: 0.68 mg/dL (ref 0.50–1.35)
GFR calc Af Amer: >90 mL/min (ref >90)
GFR calc non Af Amer: >90 mL/min (ref >90)
GLUCOSE: 85 mg/dL (ref 70–99)
POTASSIUM: 4.2 meq/L (ref 3.7–5.3)
SODIUM: 138 meq/L (ref 137–147)

## 2014-09-26 LAB — CBC
HCT: 38.6 % — ABNORMAL LOW (ref 39.0–52.0)
Hemoglobin: 13.1 g/dL (ref 13.0–17.0)
MCH: 29.5 pg (ref 26.0–34.0)
MCHC: 33.9 g/dL (ref 30.0–36.0)
MCV: 86.9 fL (ref 78.0–100.0)
Platelets: 298 10*3/uL (ref 150–400)
RBC: 4.44 MIL/uL (ref 4.22–5.81)
RDW: 12.5 % (ref 11.5–15.5)
WBC: 8.3 10*3/uL (ref 4.0–10.5)

## 2014-09-26 LAB — FLUORESCENT TREPONEMAL AB(FTA)-IGG-BLD: Fluorescent Treponemal Ab, IgG: NONREACTIVE

## 2014-09-26 NOTE — Progress Notes (Signed)
Patient ID: Charles Bates, male   DOB: [birth year], 58 y.o.   MRN: [id number] Remains comfortable. Up walking in the room. Reports some left low back pain.  Afebrile and all infectious workup negative.  Abdomen soft no tenderness specifically over his aorta.  Agree with discharge. Discussed with Dr.Buriev. Will see in the office in several months with a CT scan. The patient is to notify should he develop any new difficulties

## 2014-09-26 NOTE — Discharge Instructions (Signed)
Please follow up with primary care doctor in 2 weeks

## 2014-09-26 NOTE — Progress Notes (Signed)
Patient discharged to home with instructions. 

## 2014-09-26 NOTE — Discharge Summary (Signed)
Physician Discharge Summary  Charles Bates MEQ:[id number] DOB: [birth year] DOA: 09/24/2014  PCP: Cathlean Cower, MD  Admit date: 09/24/2014 Discharge date: 09/26/2014  Time spent: >35 minutes  Recommendations for Outpatient Follow-up:  F/u with PCP in 2 week s CT abd/pel in 2-3 month  Discharge Diagnoses:  Active Problems:   Aortitis   Abdominal pain, unspecified site   Unspecified constipation   Discharge Condition: stable   Diet recommendation: regular   Filed Weights   09/25/14 1100  Weight: 63.05 kg (139 lb)    History of present illness:  58 y.o. male who presents to the ED with worsening low back pain, left groin and left sided abdominal pain for the last 3 weeks. Pain has been worsening, pain is worse with any sort of movement even getting out of his chair causes discomfort and pain. Has had some constipation, no fevers, no chills, no weight loss. No recent injury or trauma.   Hospital Course:  1. Back pain, Pt describes as radicular pain;  -CT: Increased density surrounding the abdominal aorta without evidence  of aneurysm or dissection. This may represent inflammatory process such as aortitis -ESR, CRP not significantly elevated, blood cultures; NGTD'; , RPR, HIV neg;  -Pain resolved; Pt is ambulating well; no GI symptoms;  clinically symmetric peripheral pulses; Pt is seen examined by vascular surgery who recommended to f/u CT abd/pel in 3 month  -d/w patient recommended to f/u final test results in 1-2 weeks with PCP and further work up    Procedures:  none (i.e. Studies not automatically included, echos, thoracentesis, etc; not x-rays)  Consultations:  Vascular   Discharge Exam: Filed Vitals:   09/26/14 0549  BP: 112/75  Pulse: 78  Temp: 97.6 F (36.4 C)  Resp: 16    General: alert Cardiovascular: s1,s2 rrr Respiratory: CTA BL  Discharge Instructions  Discharge Instructions   Diet - low sodium heart healthy    Complete by:  As directed       Discharge instructions    Complete by:  As directed   Please follow up with primary care doctor in 2 weeks     Increase activity slowly    Complete by:  As directed             Medication List    STOP taking these medications       naproxen sodium 220 MG tablet  Commonly known as:  ANAPROX      TAKE these medications       acetaminophen 500 MG tablet  Commonly known as:  TYLENOL  Take 500 mg by mouth every 6 (six) hours as needed for mild pain.     BENGAY EX  Apply 1 application topically daily.       No Known Allergies     Follow-up Information   Follow up with Cathlean Cower, MD In 2 weeks.   Specialties:  Internal Medicine, Radiology   Contact information:   [address] [place] [phone]        The results of significant diagnostics from this hospitalization (including imaging, microbiology, ancillary and laboratory) are listed below for reference.    Significant Diagnostic Studies: Ct Abdomen Pelvis W Contrast  09/25/2014   CLINICAL DATA:  Left lower quadrant abdominal pain, groin pain, and lumbar pain.  EXAM: CT ABDOMEN AND PELVIS WITH CONTRAST  TECHNIQUE: Multidetector CT imaging of the abdomen and pelvis was performed using the standard protocol following bolus administration of intravenous contrast.  CONTRAST:  132m OMNIPAQUE IOHEXOL 300 MG/ML  SOLN  COMPARISON:  06/02/2009  FINDINGS: Diffuse emphysematous changes and scattered fibrosis in the lungs. The liver, spleen, gallbladder, pancreas, adrenal glands, kidneys, inferior vena cava, and retroperitoneal lymph nodes are unremarkable. There is calcification of the abdominal aorta with normal caliber. No evidence of dissection. There is increased tissue density surrounding the abdominal aorta suggesting inflammatory process, possibly aortitis. Stomach and small bowel are decompressed. Contrast material does flow through to the colon without evidence of bowel obstruction. Diffusely  stool-filled colon may correspond with clinical constipation. No free air or free fluid in the abdomen.  Pelvis: Surgical clips in the left pelvis. Prostate gland is not enlarged. Bladder is decompressed. No free or loculated pelvic fluid collections. Appendix is not identified but no specific inflammatory changes are suggested. No evidence of diverticulitis. No destructive bone lesions.  IMPRESSION: Increased density surrounding the abdominal aorta without evidence of aneurysm or dissection. This may represent inflammatory process such as aortitis. Diffusely stool-filled colon suggesting constipation. Emphysematous changes and fibrosis in the lung bases.   Electronically Signed   By: WLucienne CapersM.D.   On: 09/25/2014 01:46    Microbiology: Recent Results (from the past 240 hour(s))  CULTURE, BLOOD (ROUTINE X 2)     Status: None   Collection Time    09/25/14  3:07 AM      Result Value Ref Range Status   Specimen Description BLOOD L FOREARM   Final   Special Requests BOTTLES DRAWN AEROBIC AND ANAEROBIC 3CC EACH   Final   Culture  Setup Time     Final   Value: 09/25/2014 08:43     Performed at [facility]  Culture     Final   Value:        BLOOD CULTURE RECEIVED NO GROWTH TO DATE CULTURE WILL BE HELD FOR 5 DAYS BEFORE ISSUING A FINAL NEGATIVE REPORT     Performed at [facility]  Report Status PENDING   Incomplete  CULTURE, BLOOD (ROUTINE X 2)     Status: None   Collection Time    09/25/14  3:07 AM      Result Value Ref Range Status   Specimen Description BLOOD LEFT ANTECUBITAL   Final   Special Requests BOTTLES DRAWN AEROBIC AND ANAEROBIC 5CC EACH   Final   Culture  Setup Time     Final   Value: 09/25/2014 08:43     Performed at [facility]  Culture     Final   Value:        BLOOD CULTURE RECEIVED NO GROWTH TO DATE CULTURE WILL BE HELD FOR 5 DAYS BEFORE ISSUING A FINAL NEGATIVE REPORT     Performed at [facility]  Report Status PENDING    Incomplete     Labs: Basic Metabolic Panel:  Recent Labs Lab 09/24/14 1911 09/26/14 0422  NA 138 138  K 4.6 4.2  CL 101 102  CO2 26 26  GLUCOSE 108* 85  BUN 14 15  CREATININE 0.70 0.68  CALCIUM 9.4 8.8   Liver Function Tests:  Recent Labs Lab 09/24/14 1911  AST 16  ALT 11  ALKPHOS 99  BILITOT 0.3  PROT 7.6  ALBUMIN 3.5   No results found for this basename: LIPASE, AMYLASE,  in the last 168 hours No results found for this basename: AMMONIA,  in the last 168 hours CBC:  Recent Labs Lab 09/24/14 1911 09/26/14 0422  WBC 11.3* 8.3  NEUTROABS 8.1*  --   HGB 14.2 13.1  HCT 41.5 38.6*  MCV 88.1 86.9  PLT 334 298   Cardiac Enzymes: No results found for this basename: CKTOTAL, CKMB, CKMBINDEX, TROPONINI,  in the last 168 hours BNP: BNP (last 3 results) No results found for this basename: PROBNP,  in the last 8760 hours CBG: No results found for this basename: GLUCAP,  in the last 168 hours     Signed:  Kinnie Feil  [facility] 09/26/2014, 11:53 AM

## 2014-09-27 ENCOUNTER — Telehealth: Payer: Self-pay | Admitting: Vascular Surgery

## 2014-09-27 NOTE — Telephone Encounter (Signed)
Message copied by Gena Fray on Thu Sep 27, 2014  2:23 PM ------      Message from: Peter Minium K      Created: Wed Sep 26, 2014  1:37 PM      Regarding: Schedule                   ----- Message -----         From: Rosetta Posner, MD         Sent: 09/26/2014  12:59 PM           To: [facility] Charge Pool            Low-level hospital followup. Will be discharged today. 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 Will followup with [place] on a when necessary basis ------

## 2014-09-27 NOTE — Telephone Encounter (Signed)
Spoke with Purcell Nails to schedule his appt for 12/04/14 for CTA and MD appt.  He said that he had been unsuccessful in scheduling his PCP follow up with Dr Judi Cong office.  I offered to call for him.   Apparently it has been several years since he has seen Dr Jenny Reichmann, and therefore would need to re-establish through the PA. This is scheduled for 10/10/14 @ 9:00am.  Mr Babich is aware, dpm

## 2014-10-01 LAB — CULTURE, BLOOD (ROUTINE X 2)
CULTURE: NO GROWTH
Culture: NO GROWTH

## 2014-10-10 ENCOUNTER — Encounter: Payer: Self-pay | Admitting: Family

## 2014-10-10 ENCOUNTER — Ambulatory Visit (INDEPENDENT_AMBULATORY_CARE_PROVIDER_SITE_OTHER): Payer: BC Managed Care – PPO | Admitting: Family

## 2014-10-10 VITALS — BP 120/82 | HR 80 | Temp 97.4°F | Resp 16 | Ht 75.0 in | Wt 136.1 lb

## 2014-10-10 DIAGNOSIS — R109 Unspecified abdominal pain: Secondary | ICD-10-CM

## 2014-10-10 MED ORDER — MELOXICAM 7.5 MG PO TABS: 7.5000 mg | ORAL_TABLET | Freq: Every day | ORAL | Status: DC

## 2014-10-10 NOTE — Progress Notes (Signed)
Pre visit review using our clinic review tool, if applicable. No additional management support is needed unless otherwise documented below in the visit note. 

## 2014-10-10 NOTE — Progress Notes (Signed)
   Subjective:    Patient ID: Charles Bates, male    DOB: 19-Aug-1956, 58 y.o.   MRN: [id number]  HPI:  Charles Bates is a 58 y.o. male who presents today for hospital followup.   Was recently admitted to [facility] for worsening back pain, left groin pain, and left sided abdominal pain x 3 weeks.  CT of back revealed normal abdominal aorta with dissection. He was ambulating well at discharge with no symptoms. Diagnosed with potential aortitis / unspecified abdominal pain.  Continues to have some right/hip groin pain and and left low back/flank pain. Has been taking Aleve which takes the pain away. Has noticed some constipation, that he is treating with all natural laxative. Indicates urine can be a little more on the amber side.    Denies fever, chills, or changes to bowel/bladder habits. Denies increase in pain with coughing/sneezing.   About 5 years ago had a strangulated hernia on the right.   No Known Allergies  Current Outpatient Prescriptions on File Prior to Visit  Medication Sig Dispense Refill  . acetaminophen (TYLENOL) 500 MG tablet Take 500 mg by mouth every 6 (six) hours as needed for mild pain.      . Menthol, Topical Analgesic, (BENGAY EX) Apply 1 application topically daily.       No current facility-administered medications on file prior to visit.   No past medical history on file.  Review of Systems   See HPI    Objective:     BP 120/82  Pulse 80  Temp(Src) 97.4 F (36.3 C) (Oral)  Resp 16  Ht 6\' 3"  (1.905 m)  Wt 136 lb 1.9 oz (61.744 kg)  BMI 17.01 kg/m2  SpO2 95% Nursing note and vital signs reviewed.  Physical Exam  Constitutional: He is oriented to person, place, and time. He appears well-developed.  Thin appearing  Cardiovascular: Normal rate, regular rhythm and normal heart sounds.   Pulmonary/Chest: Effort normal and breath sounds normal.  Abdominal: Soft. Normal appearance and bowel sounds are normal. There is no  hepatosplenomegaly. There is no tenderness. There is no rigidity, no rebound, no guarding, no CVA tenderness, no tenderness at McBurney's point and negative Murphy's sign. No hernia.  Musculoskeletal:  No palpable tenderness elicited over O2-U2 region of low back and left greater trochanter but patient indicates this is where the pain occurs when it does.  Neurological: He is alert and oriented to person, place, and time.  Skin: Skin is warm and dry.  Psychiatric: He has a normal mood and affect. His behavior is normal. Judgment and thought content normal.        Assessment & Plan:

## 2014-10-10 NOTE — Patient Instructions (Signed)
Thank you for choosing [facility].  Summary/Instructions:   Your prescription has been sent to your pharmacy. Please DO NOT TAKE IBUPROFEN OR ALEVE WHILE TAKING THIS MEDICATION.  May use ice/heat 2-3x per day for 20 minutes as needed.  If symptoms worsen or fail to improve, please make a follow up appointment or go to the emergency room.   Thank you for enrolling in [facility]. Please follow the instructions below to securely access your online medical record. MyChart allows you to send messages to your doctor, view your test results, renew your prescriptions, schedule appointments, and more.  How Do I Sign Up? 1. In your Internet browser, go to [email or web address]. 2. Click on the New  User? link in the Sign In box.  3. Enter your MyChart Access Code exactly as it appears below. You will not need to use this code after you have completed the sign-up process. If you do not sign up before the expiration date, you must request a new code. MyChart Access Code: [email or web address] Expires: 11/25/2014 12:41 PM  4. Enter the last four digits of your Social Security Number (xxxx) and Date of Birth (mm/dd/yyyy) as indicated and click Next. You will be taken to the next sign-up page. 5. Create a MyChart ID. This will be your MyChart login ID and cannot be changed, so think of one that is secure and easy to remember. 6. Create a MyChart password. You can change your password at any time. 7. Enter your Password Reset Question and Answer and click Next. This can be used at a later time if you forget your password.  8. Select your communication preference, and if applicable enter your e-mail address. You will receive e-mail notification when new information is available in MyChart by choosing to receive e-mail notifications and filling in your e-mail. 9. Click Sign In. You can now view your medical record.   Additional Information If you have questions, you can email  [email or web address] or call [phone] to talk to our [facility] staff. Remember, MyChart is NOT to be used for urgent needs. For medical emergencies, dial 911.

## 2014-10-10 NOTE — Assessment & Plan Note (Signed)
Complains today of mainly hip/low back pain which appears musculoskeletal. Will treat with 10 days of Mobic to see if this improves symptoms. If does not will consider further imaging. Instructed to use ice/heat as necessary. Follow up if symptoms worsen or fail to improve. Pt is in agreement with the plan.

## 2014-10-11 ENCOUNTER — Telehealth: Payer: Self-pay | Admitting: Internal Medicine

## 2014-10-11 NOTE — Telephone Encounter (Signed)
emmi mailed  °

## 2014-10-23 ENCOUNTER — Encounter: Payer: Self-pay | Admitting: Family

## 2014-10-23 ENCOUNTER — Ambulatory Visit (INDEPENDENT_AMBULATORY_CARE_PROVIDER_SITE_OTHER): Payer: BC Managed Care – PPO | Admitting: Family

## 2014-10-23 VITALS — BP 118/74 | HR 85 | Temp 97.4°F | Resp 18 | Ht 75.0 in | Wt 134.0 lb

## 2014-10-23 DIAGNOSIS — R109 Unspecified abdominal pain: Secondary | ICD-10-CM

## 2014-10-23 MED ORDER — METHYLPREDNISOLONE (PAK) 4 MG PO TABS: ORAL_TABLET | ORAL | Status: DC

## 2014-10-23 MED ORDER — MELOXICAM 7.5 MG PO TABS: 7.5000 mg | ORAL_TABLET | Freq: Every day | ORAL | Status: DC

## 2014-10-23 NOTE — Progress Notes (Signed)
Pre visit review using our clinic review tool, if applicable. No additional management support is needed unless otherwise documented below in the visit note. 

## 2014-10-23 NOTE — Assessment & Plan Note (Addendum)
No longer having abdominal pain. Currently low back pain/hip pain is improved with the Mobic. Appears stable and positively progressing. Start medrol dose pack for inflammation and continue mobic as needed basis. Discussed getting good fitting supportive shoes to help decrease pressure. Follow up with vascular surgery in December and follow up in this office if symptoms worsen or fail to improve.

## 2014-10-23 NOTE — Patient Instructions (Addendum)
Thank you for choosing [facility].  Summary/Instructions:   Please take the Mobic today. Start the medrol dose pack tomorrow. Please follow the instructions on the package.   Please consider using thermacare or equivalent on your back as needed for heat. Continue using aspercream/bengay/icy-hot as needed.  Please follow up if symptoms worsen or fail to improve.

## 2014-10-23 NOTE — Progress Notes (Signed)
   Subjective:    Patient ID: Charles Bates, male    DOB: 01-10-1956, 58 y.o.   MRN: [id number]  Chief Complaint  Patient presents with  . Follow-up    still having pain but better than it was    HPI:  Charles Bates is a 58 y.o. male who presents today for follow up of his abdominal/back/hip pain.  Was previously seen with concern for aortitis. Pain at last visit appeared to be musculoskeltal in nature and was given Mobic to decrease inflammation. Since that time, reports improvement with the Mobic and course of the symptoms are gradually improving. Denies any adverse effects related to the mobic. Pain is usually present at the end of the day and is still located in his lower back on both side and occasionally his hip. Has a follow up with vascular in December for a second ultrasound.   No Known Allergies  Current Outpatient Prescriptions on File Prior to Visit  Medication Sig Dispense Refill  . acetaminophen (TYLENOL) 500 MG tablet Take 500 mg by mouth every 6 (six) hours as needed for mild pain.      . Menthol, Topical Analgesic, (BENGAY EX) Apply 1 application topically daily.       No current facility-administered medications on file prior to visit.    Review of Systems    See HPI  Objective:    BP 118/74  Pulse 85  Temp(Src) 97.4 F (36.3 C) (Oral)  Resp 18  Ht 6\' 3"  (1.905 m)  Wt 134 lb (60.782 kg)  BMI 16.75 kg/m2  SpO2 97% Nursing note and vital signs reviewed.  Physical Exam  Constitutional: He is oriented to person, place, and time. He appears well-developed and well-nourished. No distress.  Cardiovascular: Normal rate, regular rhythm and normal heart sounds.   Pulmonary/Chest: Effort normal and breath sounds normal.  Abdominal: Soft. Bowel sounds are normal. He exhibits no distension and no mass. There is no tenderness. There is no rebound and no guarding.  Musculoskeletal:  Mild tenderness of paraspinal musculature.   Neurological: He is alert and  oriented to person, place, and time.  Skin: Skin is warm and dry.  Psychiatric: He has a normal mood and affect. His behavior is normal. Judgment and thought content normal.       Assessment & Plan:

## 2014-10-24 ENCOUNTER — Telehealth: Payer: Self-pay | Admitting: Internal Medicine

## 2014-10-24 NOTE — Telephone Encounter (Signed)
emmi mailed  °

## 2014-11-02 ENCOUNTER — Ambulatory Visit (INDEPENDENT_AMBULATORY_CARE_PROVIDER_SITE_OTHER): Payer: BC Managed Care – PPO | Admitting: Internal Medicine

## 2014-11-02 ENCOUNTER — Other Ambulatory Visit (INDEPENDENT_AMBULATORY_CARE_PROVIDER_SITE_OTHER): Payer: BC Managed Care – PPO

## 2014-11-02 ENCOUNTER — Encounter: Payer: Self-pay | Admitting: Internal Medicine

## 2014-11-02 VITALS — BP 116/64 | HR 83 | Temp 97.5°F | Ht 75.0 in | Wt 137.4 lb

## 2014-11-02 DIAGNOSIS — Z0000 Encounter for general adult medical examination without abnormal findings: Secondary | ICD-10-CM

## 2014-11-02 DIAGNOSIS — Z720 Tobacco use: Secondary | ICD-10-CM

## 2014-11-02 DIAGNOSIS — K5909 Other constipation: Secondary | ICD-10-CM

## 2014-11-02 DIAGNOSIS — I776 Arteritis, unspecified: Secondary | ICD-10-CM

## 2014-11-02 DIAGNOSIS — F17200 Nicotine dependence, unspecified, uncomplicated: Secondary | ICD-10-CM

## 2014-11-02 DIAGNOSIS — D126 Benign neoplasm of colon, unspecified: Secondary | ICD-10-CM

## 2014-11-02 LAB — LIPID PANEL
CHOLESTEROL: 165 mg/dL (ref 0–200)
HDL: 45.6 mg/dL (ref >39.00)
LDL CALC: 106 mg/dL — AB (ref 0–99)
NonHDL: 119.4
Total CHOL/HDL Ratio: 4
Triglycerides: 66 mg/dL (ref 0.0–149.0)
VLDL: 13.2 mg/dL (ref 0.0–40.0)

## 2014-11-02 LAB — TSH: TSH: 0.87 u[IU]/mL (ref 0.35–4.50)

## 2014-11-02 LAB — PSA: PSA: 1.32 ng/mL (ref 0.10–4.00)

## 2014-11-02 MED ORDER — MELOXICAM 7.5 MG PO TABS: 7.5000 mg | ORAL_TABLET | Freq: Every day | ORAL | Status: DC

## 2014-11-02 NOTE — Assessment & Plan Note (Signed)

## 2014-11-02 NOTE — Assessment & Plan Note (Signed)
Ok for [employer] daily prn

## 2014-11-02 NOTE — Assessment & Plan Note (Signed)
Urged to quit 

## 2014-11-02 NOTE — Progress Notes (Signed)
Pre visit review using our clinic review tool, if applicable. No additional management support is needed unless otherwise documented below in the visit note. 

## 2014-11-02 NOTE — Progress Notes (Signed)
Subjective:    Patient ID: Charles Bates, male    DOB: 01-09-1956, 58 y.o.   MRN: [id number]  HPI  Here for wellness and establish as new pt;  Overall doing ok;  Pt denies CP, worsening SOB, DOE, wheezing, orthopnea, PND, worsening LE edema, palpitations, dizziness or syncope.  Pt denies neurological change such as new headache, facial or extremity weakness.  Pt denies polydipsia, polyuria, or low sugar symptoms. Pt states overall good compliance with treatment and medications, good tolerability, and has been trying to follow lower cholesterol diet.  Pt denies worsening depressive symptoms, suicidal ideation or panic. No fever, night sweats, wt loss, loss of appetite, or other constitutional symptoms.  Pt states good ability with ADL's, has low fall risk, home safety reviewed and adequate, no other significant changes in hearing or vision, and only occasionally active with exercise. Recently hospd with abd pain and lower back pain ct with peri-aortic inflammation tx with antibx,, ? Etiology - lab eval essentially neg, HIV neg, for f/u CT at 2-3 mo (already scheduled). Asks for mobic refill, Pt continues to have recurring but much less severe mid  LBP on moboic 7.5 prn, bowel or bladder change, fever, wt loss,  worsening LE pain/numbness/weakness, gait change or falls. S/p predpack last wk as well. Due for f/u colonsocpy, psa, and lipids.  Still working as Radiation protection practitioner truck driving.  No other complaints No past medical history on file. Past Surgical History  Procedure Laterality Date  . Hernia repair      reports that he has been smoking Cigarettes.  He has been smoking about 0.00 packs per day. He does not have any smokeless tobacco history on file. He reports that he does not drink alcohol. His drug history is not on file. family history is not on file. No Known Allergies Current Outpatient Prescriptions on File Prior to Visit  Medication Sig Dispense Refill  . meloxicam (MOBIC) 7.5 MG tablet Take 1  tablet (7.5 mg total) by mouth daily. 30 tablet 0  . acetaminophen (TYLENOL) 500 MG tablet Take 500 mg by mouth every 6 (six) hours as needed for mild pain.    . Menthol, Topical Analgesic, (BENGAY EX) Apply 1 application topically daily.    . methylPREDNIsolone (MEDROL DOSPACK) 4 MG tablet follow package directions 21 tablet 0   No current facility-administered medications on file prior to visit.   Still smoking  - 1 ppd for 39yr. Review of Systems Constitutional: Negative for increased diaphoresis, other activity, appetite or other siginficant weight change  HENT: Negative for worsening hearing loss, ear pain, facial swelling, mouth sores and neck stiffness.   Eyes: Negative for other worsening pain, redness or visual disturbance.  Respiratory: Negative for shortness of breath and wheezing.   Cardiovascular: Negative for chest pain and palpitations.  Gastrointestinal: Negative for diarrhea, blood in stool, abdominal distention or other pain Genitourinary: Negative for hematuria, flank pain or change in urine volume.  Musculoskeletal: Negative for myalgias or other joint complaints.  Skin: Negative for color change and wound.  Neurological: Negative for syncope and numbness. other than noted Hematological: Negative for adenopathy. or other swelling Psychiatric/Behavioral: Negative for hallucinations, self-injury, decreased concentration or other worsening agitation.      Objective:   Physical Exam BP 116/64 mmHg  Pulse 83  Temp(Src) 97.5 F (36.4 C) (Oral)  Ht 6\' 3"  (1.905 m)  Wt 137 lb 6 oz (62.313 kg)  BMI 17.17 kg/m2  SpO2 97%  Poor dentition - plans for dental  in jan 2016 VS noted,  Constitutional: Pt is oriented to person, place, and time. Appears well-developed and well-nourished.  Head: Normocephalic and atraumatic.  Right Ear: External ear normal.  Left Ear: External ear normal.  Nose: Nose normal.  Mouth/Throat: Oropharynx is clear and moist.  Eyes: Conjunctivae and  EOM are normal. Pupils are equal, round, and reactive to light.  Neck: Normal range of motion. Neck supple. No JVD present. No tracheal deviation present.  Cardiovascular: Normal rate, regular rhythm, normal heart sounds and intact distal pulses.   Pulmonary/Chest: Effort normal and breath sounds without rales or wheezing  Abdominal: Soft. Bowel sounds are normal. NT. No HSM  Musculoskeletal: Normal range of motion. Exhibits no edema.  Lymphadenopathy:  Has no cervical adenopathy.  Neurological: Pt is alert and oriented to person, place, and time. Pt has normal reflexes. No cranial nerve deficit. Motor grossly intact Skin: Skin is warm and dry. No rash noted.  Psychiatric:  Has normal mood and affect. Behavior is normal.     Assessment & Plan:

## 2014-11-02 NOTE — Assessment & Plan Note (Signed)
Due for f/u - will order

## 2014-11-02 NOTE — Assessment & Plan Note (Signed)
Clinically improved, for f/u CT as scheduled

## 2014-11-02 NOTE — Patient Instructions (Addendum)
Please continue all other medications as before, and refills have been done if requested.  Please have the pharmacy call with any other refills you may need.  Please continue your efforts at being more active, low cholesterol diet, and weight control.  You are otherwise up to date with prevention measures today.  Please keep your appointments with your specialists as you may have planned  You can also use OTC Miralax for constipation  Please stop smoking  Please make sure to see Dental as soon as you can in Jan 2016 with the insurance in effect  Please go to the LAB in the Basement (turn left off the elevator) for the tests to be done today  You will be contacted by phone if any changes need to be made immediately.  Otherwise, you will receive a letter about your results with an explanation, but please check with MyChart first.  Please remember to sign up for MyChart if you have not done so, as this will be important to you in the future with finding out test results, communicating by private email, and scheduling acute appointments online when needed.  Please return in 1 year for your yearly visit, or sooner if needed, with Lab testing done 3-5 days before

## 2014-11-06 ENCOUNTER — Encounter: Payer: Self-pay | Admitting: Internal Medicine

## 2014-12-03 ENCOUNTER — Other Ambulatory Visit: Payer: Self-pay | Admitting: *Deleted

## 2014-12-03 ENCOUNTER — Encounter: Payer: Self-pay | Admitting: Vascular Surgery

## 2014-12-03 DIAGNOSIS — Z01812 Encounter for preprocedural laboratory examination: Secondary | ICD-10-CM

## 2014-12-04 ENCOUNTER — Encounter: Payer: Self-pay | Admitting: Internal Medicine

## 2014-12-04 ENCOUNTER — Other Ambulatory Visit: Payer: Self-pay | Admitting: Vascular Surgery

## 2014-12-04 ENCOUNTER — Inpatient Hospital Stay: Admission: RE | Admit: 2014-12-04 | Payer: BC Managed Care – PPO | Source: Ambulatory Visit

## 2014-12-04 ENCOUNTER — Ambulatory Visit: Payer: BC Managed Care – PPO | Admitting: Vascular Surgery

## 2014-12-04 LAB — BUN: BUN: 13 mg/dL (ref 6–23)

## 2014-12-04 LAB — CREATININE, SERUM: Creat: 0.8 mg/dL (ref 0.50–1.35)

## 2014-12-25 ENCOUNTER — Encounter: Payer: Self-pay | Admitting: Vascular Surgery

## 2015-01-01 ENCOUNTER — Encounter: Payer: Self-pay | Admitting: Vascular Surgery

## 2015-01-01 ENCOUNTER — Ambulatory Visit
Admission: RE | Admit: 2015-01-01 | Discharge: 2015-01-01 | Disposition: A | Discharge status: Home / Self Care | Payer: BLUE CROSS/BLUE SHIELD | Source: Ambulatory Visit | Attending: Vascular Surgery | Admitting: Vascular Surgery

## 2015-01-01 ENCOUNTER — Ambulatory Visit (INDEPENDENT_AMBULATORY_CARE_PROVIDER_SITE_OTHER): Payer: BLUE CROSS/BLUE SHIELD | Admitting: Vascular Surgery

## 2015-01-01 VITALS — BP 116/78 | HR 91 | Resp 14 | Ht 75.0 in | Wt 143.0 lb

## 2015-01-01 DIAGNOSIS — I776 Arteritis, unspecified: Secondary | ICD-10-CM

## 2015-01-01 DIAGNOSIS — I77811 Abdominal aortic ectasia: Secondary | ICD-10-CM

## 2015-01-01 MED ORDER — IOHEXOL 350 MG/ML SOLN
80.0000 mL | Freq: Once | INTRAVENOUS | Status: AC | PRN
  Administered 2015-01-01: 80 mL via INTRAVENOUS

## 2015-01-01 NOTE — Progress Notes (Signed)
Patient name: Charles Bates MRN: [id number] DOB: 11-09-1956 Sex: male    Reason for referral:  Chief Complaint  Patient presents with  . New Evaluation    lower back pain    HISTORY OF PRESENT ILLNESS: Here today for hospital follow-up. He was seen in consultation 3 months ago while hospitalized. At that time he was having some difficulty with low back discomfort and had a CT scan which showed an apparent inflammatory process around his infrarenal aorta. Did not feel that this was causing his symptoms and recommended 3 month follow-up to determine if this was progressing or resolving. 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 He reports that his back pain is resolved except when he is having constipation from pain medication. After his bowel movement his back pain resolves. He does not have any abdominal pain. He has not had any fevers or constitutional symptoms.  History reviewed. No pertinent past medical history.  Past Surgical History  Procedure Laterality Date  . Hernia repair    . Hernia repair      History   Social History  . Marital Status: Single    Spouse Name: N/A    Number of Children: N/A  . Years of Education: N/A   Occupational History  . Not on file.   Social History Main Topics  . Smoking status: Current Every Day Smoker    Types: Cigarettes  . Smokeless tobacco: Never Used  . Alcohol Use: No  . Drug Use: No  . Sexual Activity: Not on file   Other Topics Concern  . Not on file   Social History Narrative    Family History  Problem Relation Age of Onset  . Varicose Veins Mother   . Diabetes Mother   . Hypertension Mother     Allergies as of 01/01/2015  . (No Known Allergies)    Current Outpatient Prescriptions on File Prior to Visit  Medication Sig Dispense Refill  . acetaminophen (TYLENOL) 500 MG tablet Take 500 mg by mouth every 6 (six) hours as needed for mild pain.    . meloxicam (MOBIC) 7.5 MG  tablet Take 1 tablet (7.5 mg total) by mouth daily. 30 tablet 5  . Menthol, Topical Analgesic, (BENGAY EX) Apply 1 application topically daily.    . methylPREDNIsolone (MEDROL DOSPACK) 4 MG tablet follow package directions 21 tablet 0   No current facility-administered medications on file prior to visit.     REVIEW OF SYSTEMS:  Positives indicated with an "X"  CARDIOVASCULAR:  [ ]  chest pain   [ ]  chest pressure   [ ]  palpitations   [ ]  orthopnea   [ ]  dyspnea on exertion   [ ]  claudication   [ ]  rest pain   [ ]  DVT   [ ]  phlebitis PULMONARY:   [ ]  productive cough   [ ]  asthma   [ ]  wheezing NEUROLOGIC:   [ ]  weakness  [ ]  paresthesias  [ ]  aphasia  [ ]  amaurosis  [ ]  dizziness HEMATOLOGIC:   [ ]  bleeding problems   [ ]  clotting disorders MUSCULOSKELETAL:  [ ]  joint pain   [ ]  joint swelling GASTROINTESTINAL: [ ]   blood in stool  [ ]   hematemesis GENITOURINARY:  [ ]   dysuria  [ ]   hematuria PSYCHIATRIC:  [ ]  history of major depression INTEGUMENTARY:  [ ]  rashes  [ ]  ulcers CONSTITUTIONAL:  [ ]  fever   [ ]  chills  PHYSICAL EXAMINATION:  General: The patient is a well-nourished male, in no acute distress. Erythema. This is his baseline Vital signs are BP 116/78 mmHg  Pulse 91  Resp 14  Ht 6\' 3"  (1.905 m)  Wt 143 lb (64.864 kg)  BMI 17.87 kg/m2 Pulmonary: There is a good air exchange . Abdomen: Soft and non-tender with normal pitch bowel sounds. No masses noted. Normal aortic pulsation Musculoskeletal: There are no major deformities.  There is no significant extremity pain. Neurologic: No focal weakness or paresthesias are detected, Skin: There are no ulcer or rashes noted. Psychiatric: The patient has normal affect. Cardiovascular: Palpable femoral pulses bilaterally   CT scan from today was reviewed with the patient. Also discussed this with the interpreting radiologist. He does not have any change in the inflammatory rind around his aorta. There has been some progression  down onto the iliac arteries as well. He does not have any evidence of aortic aneurysm.  Impression and Plan:  I very long discussion with the patient explaining the CT findings. Explained that the differential would include inflammatory process versus even a consideration of lymphoma. In discussing with the radiologist did not feel comfortable recommending a needle biopsy at the location by the level of the aorta. I did discuss the possibility of steroid treatment for treatment if this was indeed inflammatory process. I discussed this with Dr. Cecilio Asper who will see him back in the office to determine further treatment and possible rheumatologic evaluation. I will see him again on as-needed basis    EARLY, TODD [facility] Office: [phone]

## 2015-01-02 ENCOUNTER — Telehealth: Payer: Self-pay | Admitting: Internal Medicine

## 2015-01-02 DIAGNOSIS — I776 Arteritis, unspecified: Secondary | ICD-10-CM

## 2015-01-02 NOTE — Telephone Encounter (Signed)
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

## 2015-01-03 NOTE — Telephone Encounter (Signed)
Called left msg. To call back 

## 2015-01-03 NOTE — Telephone Encounter (Signed)
Called left message to call back 

## 2015-01-04 NOTE — Telephone Encounter (Signed)
Called left a detailed message of results and referral.

## 2015-01-15 ENCOUNTER — Encounter: Payer: Self-pay | Admitting: Internal Medicine

## 2015-01-26 ENCOUNTER — Ambulatory Visit (INDEPENDENT_AMBULATORY_CARE_PROVIDER_SITE_OTHER): Payer: Self-pay | Admitting: Family Medicine

## 2015-01-26 VITALS — BP 124/76 | HR 96 | Temp 98.5°F | Resp 18 | Ht 74.75 in | Wt 132.8 lb

## 2015-01-26 DIAGNOSIS — Z029 Encounter for administrative examinations, unspecified: Secondary | ICD-10-CM

## 2015-01-26 DIAGNOSIS — Z024 Encounter for examination for driving license: Secondary | ICD-10-CM

## 2015-02-10 NOTE — Progress Notes (Signed)
Commercial Driver Medical Examination   Charles Bates is a 59 y.o. male who presents today for a commercial driver fitness determination physical exam. The patient reports no problems. The following portions of the patient's history were reviewed and updated as appropriate: allergies, current medications, past family history, past medical history, past social history, past surgical history and problem list. Review of Systems A comprehensive review of systems was negative.   Objective:    Vision:  Visual Acuity Screening   Right eye Left eye Both eyes  Without correction: 20/25-2 20/30 20/20  With correction:     Comments: Peripheral Vision: Right eye 85 degrees. Left eye 85 degrees.  The patient can distinguish the colors red, amber and green.      Applicant can recognize and distinguish among traffic control signals and devices showing standard red, green, and amber colors.     Monocular Vision?: No   Hearing: Hearing Screening Comments: The patient was able to hear a forced whisper from 10 feet.       BP 124/76 mmHg  Pulse 96  Temp(Src) 98.5 F (36.9 C) (Oral)  Resp 18  Ht 6' 2.75" (1.899 m)  Wt 132 lb 12.8 oz (60.238 kg)  BMI 16.70 kg/m2  SpO2 95%  General Appearance:    Alert, cooperative, no distress, appears stated age  Head:    Normocephalic, without obvious abnormality, atraumatic  Eyes:    PERRL, conjunctiva/corneas clear, EOM's intact, fundi    benign, both eyes       Ears:    Normal TM's and external ear canals, both ears  Nose:   Nares normal, septum midline, mucosa normal, no drainage    or sinus tenderness  Throat:   Lips, mucosa, and tongue normal; teeth and gums normal  Neck:   Supple, symmetrical, trachea midline, no adenopathy;       thyroid:  No enlargement/tenderness/nodules; no carotid   bruit or JVD  Back:     Symmetric, no curvature, ROM normal, no CVA tenderness  Lungs:     Clear to auscultation bilaterally, respirations unlabored   Chest wall:    No tenderness or deformity  Heart:    Regular rate and rhythm, S1 and S2 normal, no murmur, rub   or gallop  Abdomen:     Soft, non-tender, bowel sounds active all four quadrants,    no masses, no organomegaly  Genitalia:    Rectal:    Extremities:   Extremities normal, atraumatic, no cyanosis or edema  Pulses:   2+ and symmetric all extremities  Skin:   Skin color, texture, turgor normal, no rashes or lesions  Lymph nodes:   Cervical, supraclavicular, and axillary nodes normal  Neurologic:   CNII-XII intact. Normal strength, sensation and reflexes      throughout    Labs: Lab Results  Component Value Date   PROTEINUR NEGATIVE 09/24/2014   BILIRUBINUR NEGATIVE 09/24/2014   GLUCOSEU NEGATIVE 09/24/2014     UA Dip: Glucose: Negative Protein: Trace Blood:Negative Sp Gravity: 1.030     Assessment:    Healthy male exam.  Meets standards in 60 CFR 391.41;  qualifies for 2 year certificate.    Plan:    Medical examiners certificate completed and printed. Return as needed.

## 2015-03-06 ENCOUNTER — Encounter: Payer: Self-pay | Admitting: Gastroenterology

## 2015-04-03 ENCOUNTER — Other Ambulatory Visit: Payer: Self-pay | Admitting: Rheumatology

## 2015-04-03 DIAGNOSIS — I776 Arteritis, unspecified: Secondary | ICD-10-CM

## 2015-04-09 ENCOUNTER — Ambulatory Visit
Admission: RE | Admit: 2015-04-09 | Discharge: 2015-04-09 | Disposition: A | Discharge status: Home / Self Care | Payer: BLUE CROSS/BLUE SHIELD | Source: Ambulatory Visit | Attending: Rheumatology | Admitting: Rheumatology

## 2015-04-09 DIAGNOSIS — I776 Arteritis, unspecified: Secondary | ICD-10-CM

## 2015-04-09 MED ORDER — IOPAMIDOL (ISOVUE-370) INJECTION 76%
75.0000 mL | Freq: Once | INTRAVENOUS | Status: AC | PRN
  Administered 2015-04-09: 75 mL via INTRAVENOUS

## 2015-05-08 ENCOUNTER — Other Ambulatory Visit: Payer: Self-pay | Admitting: Rheumatology

## 2015-05-08 DIAGNOSIS — I776 Arteritis, unspecified: Secondary | ICD-10-CM

## 2015-05-16 ENCOUNTER — Ambulatory Visit
Admission: RE | Admit: 2015-05-16 | Discharge: 2015-05-16 | Disposition: A | Discharge status: Home / Self Care | Payer: BLUE CROSS/BLUE SHIELD | Source: Ambulatory Visit | Attending: Rheumatology | Admitting: Rheumatology

## 2015-05-16 DIAGNOSIS — I776 Arteritis, unspecified: Secondary | ICD-10-CM

## 2015-05-16 MED ORDER — IOPAMIDOL (ISOVUE-370) INJECTION 76%
75.0000 mL | Freq: Once | INTRAVENOUS | Status: AC | PRN
  Administered 2015-05-16: 75 mL via INTRAVENOUS

## 2015-07-17 ENCOUNTER — Other Ambulatory Visit (HOSPITAL_COMMUNITY): Payer: Self-pay | Admitting: *Deleted

## 2015-07-18 ENCOUNTER — Encounter (HOSPITAL_COMMUNITY)
Admission: RE | Admit: 2015-07-18 | Discharge: 2015-07-18 | Disposition: A | Discharge status: Home / Self Care | Payer: BLUE CROSS/BLUE SHIELD | Source: Ambulatory Visit | Attending: Rheumatology | Admitting: Rheumatology

## 2015-07-18 DIAGNOSIS — M3130 Wegener's granulomatosis without renal involvement: Secondary | ICD-10-CM | POA: Diagnosis not present

## 2015-07-18 MED ORDER — METHYLPREDNISOLONE SODIUM SUCC 125 MG IJ SOLR: 100.0000 mg | INTRAMUSCULAR | Status: DC

## 2015-07-18 MED ORDER — SODIUM CHLORIDE 0.9 % IV SOLN
INTRAVENOUS | Status: DC
  Administered 2015-07-18: 09:00:00 via INTRAVENOUS

## 2015-07-18 MED ORDER — ACETAMINOPHEN 325 MG PO TABS: 650.0000 mg | ORAL_TABLET | ORAL | Status: DC

## 2015-07-18 MED ORDER — RITUXIMAB CHEMO INJECTION 500 MG/50ML
720.0000 mg | INTRAVENOUS | Status: DC
  Administered 2015-07-18: 700 mg via INTRAVENOUS
  Filled 2015-07-18: qty 70

## 2015-07-25 ENCOUNTER — Encounter (HOSPITAL_COMMUNITY)
Admission: RE | Admit: 2015-07-25 | Discharge: 2015-07-25 | Disposition: A | Discharge status: Home / Self Care | Payer: BLUE CROSS/BLUE SHIELD | Source: Ambulatory Visit | Attending: Rheumatology | Admitting: Rheumatology

## 2015-07-25 DIAGNOSIS — M3130 Wegener's granulomatosis without renal involvement: Secondary | ICD-10-CM | POA: Diagnosis not present

## 2015-07-25 MED ORDER — METHYLPREDNISOLONE SODIUM SUCC 125 MG IJ SOLR
INTRAMUSCULAR | Status: AC
  Filled 2015-07-25: qty 2

## 2015-07-25 MED ORDER — ACETAMINOPHEN 325 MG PO TABS: 650.0000 mg | ORAL_TABLET | ORAL | Status: DC

## 2015-07-25 MED ORDER — SODIUM CHLORIDE 0.9 % IV SOLN
720.0000 mg | INTRAVENOUS | Status: DC
  Administered 2015-07-25: 700 mg via INTRAVENOUS
  Filled 2015-07-25: qty 70

## 2015-07-25 MED ORDER — METHYLPREDNISOLONE SODIUM SUCC 125 MG IJ SOLR
100.0000 mg | INTRAMUSCULAR | Status: DC
  Administered 2015-07-25: 100 mg via INTRAVENOUS

## 2015-07-25 MED ORDER — SODIUM CHLORIDE 0.9 % IV SOLN
INTRAVENOUS | Status: DC
  Administered 2015-07-25 (×2): via INTRAVENOUS

## 2015-07-31 ENCOUNTER — Other Ambulatory Visit (HOSPITAL_COMMUNITY): Payer: Self-pay | Admitting: *Deleted

## 2015-08-01 ENCOUNTER — Encounter (HOSPITAL_COMMUNITY)
Admission: RE | Admit: 2015-08-01 | Discharge: 2015-08-01 | Disposition: A | Discharge status: Home / Self Care | Payer: BLUE CROSS/BLUE SHIELD | Source: Ambulatory Visit | Attending: Rheumatology | Admitting: Rheumatology

## 2015-08-01 ENCOUNTER — Inpatient Hospital Stay (HOSPITAL_COMMUNITY): Admission: RE | Admit: 2015-08-01 | Payer: BLUE CROSS/BLUE SHIELD | Source: Ambulatory Visit

## 2015-08-01 DIAGNOSIS — M3130 Wegener's granulomatosis without renal involvement: Secondary | ICD-10-CM | POA: Insufficient documentation

## 2015-08-01 MED ORDER — ACETAMINOPHEN 325 MG PO TABS: 650.0000 mg | ORAL_TABLET | ORAL | Status: DC

## 2015-08-01 MED ORDER — METHYLPREDNISOLONE SODIUM SUCC 125 MG IJ SOLR
100.0000 mg | INTRAMUSCULAR | Status: DC
  Administered 2015-08-01: 100 mg via INTRAVENOUS

## 2015-08-01 MED ORDER — SODIUM CHLORIDE 0.9 % IV SOLN
720.0000 mg | INTRAVENOUS | Status: DC
  Administered 2015-08-01: 700 mg via INTRAVENOUS
  Filled 2015-08-01 (×2): qty 70

## 2015-08-01 MED ORDER — METHYLPREDNISOLONE SODIUM SUCC 125 MG IJ SOLR
INTRAMUSCULAR | Status: AC
  Administered 2015-08-01: 100 mg via INTRAVENOUS
  Filled 2015-08-01: qty 2

## 2015-08-01 MED ORDER — ACETAMINOPHEN 325 MG PO TABS
ORAL_TABLET | ORAL | Status: AC
  Filled 2015-08-01: qty 1

## 2015-08-01 MED ORDER — SODIUM CHLORIDE 0.9 % IV SOLN
INTRAVENOUS | Status: DC
  Administered 2015-08-01: 08:00:00 via INTRAVENOUS

## 2015-09-12 ENCOUNTER — Other Ambulatory Visit: Payer: Self-pay | Admitting: Physician Assistant

## 2015-09-12 DIAGNOSIS — I776 Arteritis, unspecified: Secondary | ICD-10-CM

## 2015-09-18 ENCOUNTER — Ambulatory Visit
Admission: RE | Admit: 2015-09-18 | Discharge: 2015-09-18 | Disposition: A | Discharge status: Home / Self Care | Payer: BLUE CROSS/BLUE SHIELD | Source: Ambulatory Visit | Attending: Physician Assistant | Admitting: Physician Assistant

## 2015-09-18 DIAGNOSIS — I776 Arteritis, unspecified: Secondary | ICD-10-CM

## 2015-09-18 MED ORDER — IOPAMIDOL (ISOVUE-370) INJECTION 76%
75.0000 mL | Freq: Once | INTRAVENOUS | Status: AC | PRN
Start: 2015-09-18 — End: 2015-09-18
  Administered 2015-09-18: 75 mL via INTRAVENOUS

## 2015-11-07 ENCOUNTER — Encounter: Payer: BC Managed Care – PPO | Admitting: Internal Medicine

## 2016-01-02 ENCOUNTER — Encounter: Payer: Self-pay | Admitting: Gastroenterology

## 2016-01-02 ENCOUNTER — Ambulatory Visit (INDEPENDENT_AMBULATORY_CARE_PROVIDER_SITE_OTHER): Payer: BLUE CROSS/BLUE SHIELD | Admitting: Internal Medicine

## 2016-01-02 ENCOUNTER — Other Ambulatory Visit (INDEPENDENT_AMBULATORY_CARE_PROVIDER_SITE_OTHER): Payer: BLUE CROSS/BLUE SHIELD

## 2016-01-02 ENCOUNTER — Encounter: Payer: Self-pay | Admitting: Internal Medicine

## 2016-01-02 VITALS — BP 114/68 | HR 89 | Temp 97.7°F | Ht 75.0 in | Wt 151.0 lb

## 2016-01-02 DIAGNOSIS — F17200 Nicotine dependence, unspecified, uncomplicated: Secondary | ICD-10-CM

## 2016-01-02 DIAGNOSIS — Z23 Encounter for immunization: Secondary | ICD-10-CM

## 2016-01-02 DIAGNOSIS — Z0000 Encounter for general adult medical examination without abnormal findings: Secondary | ICD-10-CM | POA: Diagnosis not present

## 2016-01-02 DIAGNOSIS — D126 Benign neoplasm of colon, unspecified: Secondary | ICD-10-CM | POA: Diagnosis not present

## 2016-01-02 DIAGNOSIS — I776 Arteritis, unspecified: Secondary | ICD-10-CM

## 2016-01-02 DIAGNOSIS — Z92241 Personal history of systemic steroid therapy: Secondary | ICD-10-CM

## 2016-01-02 DIAGNOSIS — Z720 Tobacco use: Secondary | ICD-10-CM | POA: Diagnosis not present

## 2016-01-02 DIAGNOSIS — E785 Hyperlipidemia, unspecified: Secondary | ICD-10-CM | POA: Insufficient documentation

## 2016-01-02 LAB — BASIC METABOLIC PANEL
BUN: 9 mg/dL (ref 6–23)
CHLORIDE: 106 meq/L (ref 96–112)
CO2: 26 meq/L (ref 19–32)
CREATININE: 0.92 mg/dL (ref 0.40–1.50)
Calcium: 9.6 mg/dL (ref 8.4–10.5)
GFR: 107.29 mL/min (ref >60.00)
Glucose, Bld: 93 mg/dL (ref 70–99)
POTASSIUM: 4.1 meq/L (ref 3.5–5.1)
Sodium: 141 mEq/L (ref 135–145)

## 2016-01-02 LAB — CBC WITH DIFFERENTIAL/PLATELET
BASOS ABS: 0 10*3/uL (ref 0.0–0.1)
BASOS PCT: 0.3 % (ref 0.0–3.0)
EOS ABS: 0.1 10*3/uL (ref 0.0–0.7)
Eosinophils Relative: 0.6 % (ref 0.0–5.0)
HCT: 48.9 % (ref 39.0–52.0)
Hemoglobin: 16.3 g/dL (ref 13.0–17.0)
LYMPHS ABS: 2.1 10*3/uL (ref 0.7–4.0)
Lymphocytes Relative: 16.2 % (ref 12.0–46.0)
MCHC: 33.3 g/dL (ref 30.0–36.0)
MCV: 91.1 fl (ref 78.0–100.0)
MONOS PCT: 5.2 % (ref 3.0–12.0)
Monocytes Absolute: 0.7 10*3/uL (ref 0.1–1.0)
NEUTROS ABS: 10 10*3/uL — AB (ref 1.4–7.7)
NEUTROS PCT: 77.7 % — AB (ref 43.0–77.0)
PLATELETS: 329 10*3/uL (ref 150.0–400.0)
RBC: 5.36 Mil/uL (ref 4.22–5.81)
RDW: 13.4 % (ref 11.5–15.5)
WBC: 12.9 10*3/uL — ABNORMAL HIGH (ref 4.0–10.5)

## 2016-01-02 LAB — URINALYSIS, ROUTINE W REFLEX MICROSCOPIC
Bilirubin Urine: NEGATIVE
HGB URINE DIPSTICK: NEGATIVE
Leukocytes, UA: NEGATIVE
Nitrite: NEGATIVE
RBC / HPF: NONE SEEN (ref 0–?)
Total Protein, Urine: NEGATIVE
URINE GLUCOSE: NEGATIVE
UROBILINOGEN UA: 1 (ref 0.0–1.0)
pH: 5.5 (ref 5.0–8.0)

## 2016-01-02 LAB — PSA: PSA: 1.3 ng/mL (ref 0.10–4.00)

## 2016-01-02 LAB — HEPATIC FUNCTION PANEL
ALK PHOS: 57 U/L (ref 39–117)
ALT: 11 U/L (ref 0–53)
AST: 15 U/L (ref 0–37)
Albumin: 4.1 g/dL (ref 3.5–5.2)
BILIRUBIN DIRECT: 0.1 mg/dL (ref 0.0–0.3)
TOTAL PROTEIN: 6.3 g/dL (ref 6.0–8.3)
Total Bilirubin: 0.8 mg/dL (ref 0.2–1.2)

## 2016-01-02 LAB — LIPID PANEL
Cholesterol: 254 mg/dL — ABNORMAL HIGH (ref 0–200)
HDL: 61.6 mg/dL (ref >39.00)
LDL Cholesterol: 171 mg/dL — ABNORMAL HIGH (ref 0–99)
NonHDL: 192.49
TRIGLYCERIDES: 108 mg/dL (ref 0.0–149.0)
Total CHOL/HDL Ratio: 4
VLDL: 21.6 mg/dL (ref 0.0–40.0)

## 2016-01-02 LAB — TSH: TSH: 1.02 u[IU]/mL (ref 0.35–4.50)

## 2016-01-02 MED ORDER — ROSUVASTATIN CALCIUM 10 MG PO TABS: 10.0000 mg | ORAL_TABLET | Freq: Every day | ORAL | Status: DC

## 2016-01-02 MED ORDER — ASPIRIN EC 81 MG PO TBEC
81.0000 mg | DELAYED_RELEASE_TABLET | Freq: Every day | ORAL | Status: DC
Start: 2016-01-02 — End: 2017-03-24

## 2016-01-02 NOTE — Assessment & Plan Note (Signed)
Overdue for f/u, pt to be referred back to DR Ardis Hughs

## 2016-01-02 NOTE — Assessment & Plan Note (Signed)
Urged to quit 

## 2016-01-02 NOTE — Assessment & Plan Note (Signed)
To be weaned off in coming months per pt

## 2016-01-02 NOTE — Patient Instructions (Addendum)
You had the flu shot today, and the Tetanus (Tdap)   Your EKG was OK today  Please take all new medication as prescribed - the crestor 10 mg per day for cholesterol  Please start Aspirin 81 mg (enteric coated only) - 1 per day -VERY important to reduce risk of heart attack and stroke  Please re-check your weight today as you were weighed to start with very heavy clothes and boots on  Please continue all other medications as before, and refills have been done if requested.  Please have the pharmacy call with any other refills you may need.  Please continue your efforts at being more active, low cholesterol diet, and weight control.  You are otherwise up to date with prevention measures today.  Please keep your appointments with your specialists as you may have planned  You will be contacted regarding the referral for: colonoscopy  Please go to the LAB in the Basement (turn left off the elevator) for the tests to be done today  You will be contacted by phone if any changes need to be made immediately.  Otherwise, you will receive a letter about your results with an explanation, but please check with MyChart first.  Please remember to sign up for MyChart if you have not done so, as this will be important to you in the future with finding out test results, communicating by private email, and scheduling acute appointments online when needed.  Please return in 1 year for your yearly visit, or sooner if needed, with Lab testing done 3-5 days before

## 2016-01-02 NOTE — Addendum Note (Signed)
Addended by: Lyman Bishop on: 01/02/2016 09:29 AM   Modules accepted: Orders

## 2016-01-02 NOTE — Progress Notes (Signed)
Subjective:    Patient ID: Charles Bates, male    DOB: [birth year], 60 y.o.   MRN: [id number]  HPI  Here for wellness and f/u;  Overall doing ok;  Pt denies Chest pain, worsening SOB, DOE, wheezing, orthopnea, PND, worsening LE edema, palpitations, dizziness or syncope.  Pt denies neurological change such as new headache, facial or extremity weakness.  Pt denies polydipsia, polyuria, or low sugar symptoms. Pt states overall good compliance with treatment and medications, good tolerability, and has been trying to follow appropriate diet.  Pt denies worsening depressive symptoms, suicidal ideation or panic. No fever, night sweats, wt loss, loss of appetite, or other constitutional symptoms.  Pt states good ability with ADL's, has low fall risk, home safety reviewed and adequate, no other significant changes in hearing or vision, and only occasionally active with exercise.  Being weaned off the prednisone per rheum/Dr Lenna Gilford soon. Overdue for f/u colonoscopy with adenom polyp found 2010 per Dr Ardis Hughs.  Wt incresaed but wearing all heavy clothing and boots today  Still smoking but down to 1/2 lb.  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  Has been thinking about retirement at 21 but has not realized he may not be eligible for employer based healthcare coverage if he does this Wt Readings from Last 3 Encounters:  01/02/16 165 lb (74.844 kg)  08/01/15 140 lb (63.504 kg)  07/25/15 145 lb (65.772 kg)   No past medical history on file. Past Surgical History  Procedure Laterality Date  . Hernia repair    . Hernia repair      reports that he has been smoking Cigarettes.  He has a 20 pack-year smoking history. He has never used smokeless tobacco. He reports that he does not drink alcohol or use illicit drugs. family history includes Diabetes in his mother; Heart disease in his mother; Hypertension in his mother; Varicose Veins in his mother. No Known Allergies Current Outpatient  Prescriptions on File Prior to Visit  Medication Sig Dispense Refill  . meloxicam (MOBIC) 7.5 MG tablet Take 1 tablet (7.5 mg total) by mouth daily. (Patient not taking: Reported on 01/02/2016) 30 tablet 5  . Menthol, Topical Analgesic, (BENGAY EX) Apply 1 application topically daily. Reported on 01/02/2016     No current facility-administered medications on file prior to visit.   Review of Systems Constitutional: Negative for increased diaphoresis, other activity, appetite or siginficant weight change other than noted HENT: Negative for worsening hearing loss, ear pain, facial swelling, mouth sores and neck stiffness.   Eyes: Negative for other worsening pain, redness or visual disturbance.  Respiratory: Negative for shortness of breath and wheezing  Cardiovascular: Negative for chest pain and palpitations.  Gastrointestinal: Negative for diarrhea, blood in stool, abdominal distention or other pain Genitourinary: Negative for hematuria, flank pain or change in urine volume.  Musculoskeletal: Negative for myalgias or other joint complaints.  Skin: Negative for color change and wound or drainage.  Neurological: Negative for syncope and numbness. other than noted Hematological: Negative for adenopathy. or other swelling Psychiatric/Behavioral: Negative for hallucinations, SI, self-injury, decreased concentration or other worsening agitation.      Objective:   Physical Exam BP 114/68 mmHg  Pulse 89  Temp(Src) 97.7 F (36.5 C) (Oral)  Ht 6\' 3"  (1.905 m)  Wt 165 lb (74.844 kg)  BMI 20.62 kg/m2  SpO2 95% VS noted,  Constitutional: Pt is oriented to person, place, and time. Appears well-developed and well-nourished, in no significant distress Head:  Normocephalic and atraumatic.  Right Ear: External ear normal.  Left Ear: External ear normal.  Nose: Nose normal.  Mouth/Throat: Oropharynx is clear and moist.  Eyes: Conjunctivae and EOM are normal. Pupils are equal, round, and reactive to  light.  Neck: Normal range of motion. Neck supple. No JVD present. No tracheal deviation present or significant neck LA or mass Cardiovascular: Normal rate, regular rhythm, normal heart sounds and intact distal pulses.   Pulmonary/Chest: Effort normal and breath sounds without rales or wheezing  Abdominal: Soft. Bowel sounds are normal. NT. No HSM  Musculoskeletal: Normal range of motion. Exhibits no edema.  Lymphadenopathy:  Has no cervical adenopathy.  Neurological: Pt is alert and oriented to person, place, and time. Pt has normal reflexes. No cranial nerve deficit. Motor grossly intact Skin: Skin is warm and dry. No rash noted.  Psychiatric:  Has normal mood and affect. Behavior is normal.     Assessment & Plan:

## 2016-01-02 NOTE — Assessment & Plan Note (Signed)
Mild ldl elev, but with atherosclerosis on ct angio will need tx more aggressive - to start crestor 10 qd

## 2016-01-02 NOTE — Assessment & Plan Note (Signed)

## 2016-01-02 NOTE — Progress Notes (Signed)
Pre visit review using our clinic review tool, if applicable. No additional management support is needed unless otherwise documented below in the visit note. 

## 2016-01-02 NOTE — Assessment & Plan Note (Signed)
Also with atherosclerosi noted on CT angio, per rheum

## 2016-02-25 ENCOUNTER — Ambulatory Visit (AMBULATORY_SURGERY_CENTER): Payer: Self-pay | Admitting: *Deleted

## 2016-02-25 VITALS — Ht 75.0 in | Wt 150.8 lb

## 2016-02-25 DIAGNOSIS — Z86010 Personal history of colonic polyps: Secondary | ICD-10-CM

## 2016-02-25 MED ORDER — NA SULFATE-K SULFATE-MG SULF 17.5-3.13-1.6 GM/177ML PO SOLN: ORAL | Status: DC

## 2016-02-25 NOTE — Progress Notes (Signed)
No egg or soy allergy  No anesthesia or intubation problems per pt  No diet medications taken  Registered in EMMI   

## 2016-03-03 ENCOUNTER — Encounter: Payer: Self-pay | Admitting: Gastroenterology

## 2016-03-03 ENCOUNTER — Ambulatory Visit (AMBULATORY_SURGERY_CENTER): Payer: BLUE CROSS/BLUE SHIELD | Admitting: Gastroenterology

## 2016-03-03 VITALS — BP 122/64 | HR 75 | Temp 95.7°F | Resp 15 | Ht 75.0 in | Wt 150.0 lb

## 2016-03-03 DIAGNOSIS — D122 Benign neoplasm of ascending colon: Secondary | ICD-10-CM | POA: Diagnosis not present

## 2016-03-03 DIAGNOSIS — Z86010 Personal history of colonic polyps: Secondary | ICD-10-CM | POA: Diagnosis present

## 2016-03-03 DIAGNOSIS — D123 Benign neoplasm of transverse colon: Secondary | ICD-10-CM

## 2016-03-03 MED ORDER — SODIUM CHLORIDE 0.9 % IV SOLN: 500.0000 mL | INTRAVENOUS | Status: DC

## 2016-03-03 NOTE — Patient Instructions (Addendum)
One of your biggest health concerns is your smoking.  This increases your risk for most cancers and serious cardiovascular diseases such as strokes, heart attacks.  You should try your best to stop.  If you need assistance, please contact your PCP or Smoking Cessation Class at [facility] [phone]) or [place] (1-800-QUIT-NOW).      YOU HAD AN ENDOSCOPIC PROCEDURE TODAY AT [place] [facility]:   Refer to the procedure report that was given to you for any specific questions about what was found during the examination.  If the procedure report does not answer your questions, please call your gastroenterologist to clarify.  If you requested that your care partner not be given the details of your procedure findings, then the procedure report has been included in a sealed envelope for you to review at your convenience later.  YOU SHOULD EXPECT: Some feelings of bloating in the abdomen. Passage of more gas than usual.  Walking can help get rid of the air that was put into your GI tract during the procedure and reduce the bloating. If you had a lower endoscopy (such as a colonoscopy or flexible sigmoidoscopy) you may notice spotting of blood in your stool or on the toilet paper. If you underwent a bowel prep for your procedure, you may not have a normal bowel movement for a few days.  Please Note:  You might notice some irritation and congestion in your nose or some drainage.  This is from the oxygen used during your procedure.  There is no need for concern and it should clear up in a day or so.  SYMPTOMS TO REPORT IMMEDIATELY:   Following lower endoscopy (colonoscopy or flexible sigmoidoscopy):  Excessive amounts of blood in the stool  Significant tenderness or worsening of abdominal pains  Swelling of the abdomen that is new, acute  Fever of 100F or higher    For urgent or emergent issues, a gastroenterologist can be reached at any hour by calling [phone]  [phone].   DIET: Your first meal following the procedure should be a small meal and then it is ok to progress to your normal diet. Heavy or fried foods are harder to digest and may make you feel nauseous or bloated.  Likewise, meals heavy in dairy and vegetables can increase bloating.  Drink plenty of fluids but you should avoid alcoholic beverages for 24 hours.  ACTIVITY:  You should plan to take it easy for the rest of today and you should NOT DRIVE or use heavy machinery until tomorrow (because of the sedation medicines used during the test).    FOLLOW UP: Our staff will call the number listed on your records the next business day following your procedure to check on you and address any questions or concerns that you may have regarding the information given to you following your procedure. If we do not reach you, we will leave a message.  However, if you are feeling well and you are not experiencing any problems, there is no need to return our call.  We will assume that you have returned to your regular daily activities without incident.  If any biopsies were taken you will be contacted by phone or by letter within the next 1-3 weeks.  Please call us at [phone] if you have not heard about the biopsies in 3 weeks.    SIGNATURES/CONFIDENTIALITY: You and/or your care partner have signed paperwork which will be entered into your electronic medical record.  These signatures  attest to the fact that that the information above on your After Visit Summary has been reviewed and is understood.  Full responsibility of the confidentiality of this discharge information lies with you and/or your care-partner.   Resume medications. Information given on polyps.

## 2016-03-03 NOTE — Progress Notes (Signed)
Report to PACU, RN, vss, BBS= Clear.  

## 2016-03-03 NOTE — Progress Notes (Signed)
Called to room to assist during endoscopic procedure.  Patient ID and intended procedure confirmed with present staff. Received instructions for my participation in the procedure from the performing physician.  

## 2016-03-03 NOTE — Op Note (Signed)
[facility]  [address] [place], [id number]   COLONOSCOPY PROCEDURE REPORT  PATIENT: Charles Bates, Charles Bates  MR#: [id number] BIRTHDATE: January 15, 1956 , 59  yrs. old GENDER: male ENDOSCOPIST: Milus Banister, MD PROCEDURE DATE:  03/03/2016 PROCEDURE:   Colonoscopy, surveillance and Colonoscopy with snare polypectomy First Screening Colonoscopy - Avg.  risk and is 50 yrs.  old or older - No.  Prior Negative Screening - Now for repeat screening. N/A  History of Adenoma - Now for follow-up colonoscopy & has been > or = to 3 yrs.  Yes hx of adenoma.  Has been 3 or more years since last colonoscopy.  Polyps removed today? Yes ASA CLASS:   Class II INDICATIONS:Surveillance due to prior colonic neoplasia and Colonoscopy 2010 Dr.  Ardis Hughs, 9mm TA removed. MEDICATIONS: Monitored anesthesia care and Propofol 200 mg IV  DESCRIPTION OF PROCEDURE:   After the risks benefits and alternatives of the procedure were thoroughly explained, informed consent was obtained.  The digital rectal exam revealed no abnormalities of the rectum.   The LB [id number]  endoscope was introduced through the anus and advanced to the cecum, which was identified by both the appendix and ileocecal valve. No adverse events experienced.   The quality of the prep was excellent.  The instrument was then slowly withdrawn as the colon was fully examined. Estimated blood loss is zero unless otherwise noted in this procedure report.   COLON FINDINGS: Two sessile polyps ranging between 3-21mm in size were found in the transverse colon and ascending colon. Polypectomies were performed with a cold snare.  The resection was complete, the polyp tissue was partially retrieved and sent to histology.   The examination was otherwise normal.  Retroflexed views revealed no abnormalities. The time to cecum = 3.7 Withdrawal time = [id number]   The scope was withdrawn and the procedure completed. COMPLICATIONS: There  were no immediate complications.  ENDOSCOPIC IMPRESSION: 1. Two sessile polyps ranging between 3-51mm in size were found in the transverse colon and ascending colon; polypectomies were performed with a cold snare 2.   The examination was otherwise normal  RECOMMENDATIONS: If the polyp(s) removed today are proven to be adenomatous (pre-cancerous) polyps, you will need a repeat colonoscopy in 5 years.  Otherwise you should continue to follow colorectal cancer screening guidelines for "routine risk" patients with colonoscopy in 10 years.  You will receive a letter within 1-2 weeks with the results of your biopsy as well as final recommendations.  Please call my office if you have not received a letter after 3 weeks.  eSigned:  Milus Banister, MD 2016-03-03 [id number]

## 2016-03-04 ENCOUNTER — Telehealth: Payer: Self-pay

## 2016-03-04 NOTE — Telephone Encounter (Signed)
  Follow up Call-  Call back number 03/03/2016  Post procedure Call Back phone  # [phone]  Permission to leave phone message Yes    Patient was called for follow up after procedure on 03/03/2016. No answer at the number given for follow up phone call. A message was left on the answering machine.

## 2016-03-10 ENCOUNTER — Encounter: Payer: Self-pay | Admitting: Gastroenterology

## 2016-06-01 ENCOUNTER — Other Ambulatory Visit: Payer: Self-pay | Admitting: Physician Assistant

## 2016-06-01 DIAGNOSIS — I776 Arteritis, unspecified: Secondary | ICD-10-CM

## 2016-06-08 ENCOUNTER — Ambulatory Visit
Admission: RE | Admit: 2016-06-08 | Discharge: 2016-06-08 | Disposition: A | Discharge status: Home / Self Care | Payer: BLUE CROSS/BLUE SHIELD | Source: Ambulatory Visit | Attending: Physician Assistant | Admitting: Physician Assistant

## 2016-06-08 DIAGNOSIS — I776 Arteritis, unspecified: Secondary | ICD-10-CM

## 2016-06-08 MED ORDER — IOPAMIDOL (ISOVUE-370) INJECTION 76%
100.0000 mL | Freq: Once | INTRAVENOUS | Status: AC | PRN
  Administered 2016-06-08: 100 mL via INTRAVENOUS

## 2017-01-27 ENCOUNTER — Ambulatory Visit: Payer: Self-pay

## 2017-01-28 ENCOUNTER — Ambulatory Visit: Payer: Self-pay

## 2017-03-24 ENCOUNTER — Ambulatory Visit (INDEPENDENT_AMBULATORY_CARE_PROVIDER_SITE_OTHER): Payer: Self-pay | Admitting: Physician Assistant

## 2017-03-24 VITALS — BP 136/72 | HR 81 | Temp 97.8°F | Resp 17 | Ht 75.5 in | Wt 132.0 lb

## 2017-03-24 DIAGNOSIS — Z0289 Encounter for other administrative examinations: Secondary | ICD-10-CM

## 2017-03-24 NOTE — Patient Instructions (Signed)
     IF you received an x-ray today, you will receive an invoice from [facility]. Please contact [facility] at [phone] with questions or concerns regarding your invoice.   IF you received labwork today, you will receive an invoice from [facility]. Please contact [facility] at [phone] with questions or concerns regarding your invoice.   Our billing staff will not be able to assist you with questions regarding bills from these companies.  You will be contacted with the lab results as soon as they are available. The fastest way to get your results is to activate your My Chart account. Instructions are located on the last page of this paperwork. If you have not heard from us regarding the results in 2 weeks, please contact this office.     

## 2017-03-24 NOTE — Progress Notes (Signed)
Commercial Driver Medical Examination   Charles Bates is a 61 y.o. male who presents today for a commercial driver fitness determination physical exam. The patient reports no problems today. In the past the patient reports receiving 2 year certificates. he denies focal neurological deficits, vision and hearing changes. He denies the habitual use of benzodiazepines and opioids. No history of CAD, OSA, respiratory problems.    Past Medical History:  Diagnosis Date  . Hyperlipidemia     Current medications, family history, allergies, social history reviewed by me and exist elsewhere in the encounter.   Review of Systems  Constitutional: Negative for chills, diaphoresis and fever.  Eyes: Negative.   Respiratory: Negative for cough, hemoptysis, sputum production, shortness of breath and wheezing.   Cardiovascular: Negative for chest pain, orthopnea and leg swelling.  Gastrointestinal: Negative for nausea.  Neurological: Negative for dizziness, sensory change, speech change, focal weakness and headaches.    Objective:     Vision/hearing:  Visual Acuity Screening   Right eye Left eye Both eyes  Without correction: 20/20 20/20 20/20   With correction:     Hearing Screening Comments: Peripheral Vision: Right eye 85 degrees. Left eye 85 degrees. The patient can distinguish the colors red, amber and green. The patient was able to hear a forced whisper from L=10 R=10  feet.  Applicant can recognize and distinguish among traffic control signals and devices showing standard red, green, and amber colors.  Corrective lenses required: No  Monocular Vision?: No  Hearing aid requirement: No  Physical Exam  Constitutional: He is oriented to person, place, and time.  HENT:  Right Ear: Hearing, tympanic membrane, external ear and ear canal normal.  Left Ear: Hearing, tympanic membrane, external ear and ear canal normal.  Nose: Nose normal. Right sinus exhibits no maxillary sinus tenderness  and no frontal sinus tenderness. Left sinus exhibits no maxillary sinus tenderness and no frontal sinus tenderness.  Mouth/Throat: Uvula is midline, oropharynx is clear and moist and mucous membranes are normal. No oropharyngeal exudate, posterior oropharyngeal edema or tonsillar abscesses.  Eyes: EOM are normal. Pupils are equal, round, and reactive to light.  Cardiovascular: Normal rate, regular rhythm, S1 normal, S2 normal and normal pulses.  Exam reveals no gallop and no friction rub.   No murmur heard. Pulmonary/Chest: Effort normal. No stridor. No respiratory distress. He has no wheezes. He has no rales.  Abdominal: Soft. Bowel sounds are normal. He exhibits no distension. There is no tenderness.  Musculoskeletal: He exhibits no edema.  Lymphadenopathy:       Head (right side): No submandibular and no tonsillar adenopathy present.       Head (left side): No submandibular and no tonsillar adenopathy present.    He has no cervical adenopathy.  Neurological: He is alert and oriented to person, place, and time. He has normal strength and normal reflexes. He is not disoriented. He displays no atrophy and no tremor. No cranial nerve deficit or sensory deficit. He exhibits normal muscle tone. He displays a negative Romberg sign. He displays no seizure activity. Coordination and gait normal.  Psychiatric: His behavior is normal.    BP 136/72   Pulse 81   Temp 97.8 F (36.6 C) (Oral)   Resp 17   Ht 6' 3.5" (1.918 m)   Wt 132 lb (59.9 kg)   SpO2 97%   BMI 16.28 kg/m   Labs: Comments: spgr:1.015, Glu:neg, Blood:neg, Pro:neg    Visual Acuity Screening   Right eye Left eye Both eyes  Without correction: 20/20 20/20 20/20   With correction:      Right eye 85 degrees. Left eye 85 degrees. The patient can distinguish the colors red, amber and green. The patient was able to hear a forced whisper from L=10 R=10  feet.   Assessment:    Healthy male exam.  Meets standards in 18 CFR  391.41;  qualifies for 2 year certificate.    Plan:    Medical examiners certificate completed and printed. Return as needed.

## 2021-04-15 ENCOUNTER — Encounter: Payer: Self-pay | Admitting: Gastroenterology

## 2021-09-06 ENCOUNTER — Emergency Department (HOSPITAL_COMMUNITY): Payer: Self-pay

## 2021-09-06 ENCOUNTER — Emergency Department (HOSPITAL_COMMUNITY)
Admission: EM | Admit: 2021-09-06 | Discharge: 2021-09-07 | Disposition: A | Discharge status: Home / Self Care | Payer: Self-pay | Attending: Emergency Medicine | Admitting: Emergency Medicine

## 2021-09-06 ENCOUNTER — Encounter (HOSPITAL_COMMUNITY): Payer: Self-pay | Admitting: Emergency Medicine

## 2021-09-06 DIAGNOSIS — R000 Tachycardia, unspecified: Secondary | ICD-10-CM | POA: Insufficient documentation

## 2021-09-06 DIAGNOSIS — Z79899 Other long term (current) drug therapy: Secondary | ICD-10-CM | POA: Insufficient documentation

## 2021-09-06 DIAGNOSIS — R4182 Altered mental status, unspecified: Secondary | ICD-10-CM | POA: Insufficient documentation

## 2021-09-06 DIAGNOSIS — F17210 Nicotine dependence, cigarettes, uncomplicated: Secondary | ICD-10-CM | POA: Insufficient documentation

## 2021-09-06 DIAGNOSIS — F10129 Alcohol abuse with intoxication, unspecified: Secondary | ICD-10-CM | POA: Insufficient documentation

## 2021-09-06 DIAGNOSIS — W19XXXA Unspecified fall, initial encounter: Secondary | ICD-10-CM | POA: Insufficient documentation

## 2021-09-06 DIAGNOSIS — Y908 Blood alcohol level of 240 mg/100 ml or more: Secondary | ICD-10-CM | POA: Insufficient documentation

## 2021-09-06 DIAGNOSIS — F10920 Alcohol use, unspecified with intoxication, uncomplicated: Secondary | ICD-10-CM

## 2021-09-06 DIAGNOSIS — S50812A Abrasion of left forearm, initial encounter: Secondary | ICD-10-CM | POA: Insufficient documentation

## 2021-09-06 LAB — CBC WITH DIFFERENTIAL/PLATELET
Abs Immature Granulocytes: 0.03 10*3/uL (ref 0.00–0.07)
Basophils Absolute: 0.1 10*3/uL (ref 0.0–0.1)
Basophils Relative: 1 %
Eosinophils Absolute: 0.1 10*3/uL (ref 0.0–0.5)
Eosinophils Relative: 2 %
HCT: 46.3 % (ref 39.0–52.0)
Hemoglobin: 15 g/dL (ref 13.0–17.0)
Immature Granulocytes: 0 %
Lymphocytes Relative: 31 %
Lymphs Abs: 2.1 10*3/uL (ref 0.7–4.0)
MCH: 33.9 pg (ref 26.0–34.0)
MCHC: 32.4 g/dL (ref 30.0–36.0)
MCV: 104.5 fL — ABNORMAL HIGH (ref 80.0–100.0)
Monocytes Absolute: 0.5 10*3/uL (ref 0.1–1.0)
Monocytes Relative: 7 %
Neutro Abs: 4 10*3/uL (ref 1.7–7.7)
Neutrophils Relative %: 59 %
Platelets: 143 10*3/uL — ABNORMAL LOW (ref 150–400)
RBC: 4.43 MIL/uL (ref 4.22–5.81)
RDW: 12.9 % (ref 11.5–15.5)
WBC: 6.8 10*3/uL (ref 4.0–10.5)
nRBC: 0 % (ref 0.0–0.2)

## 2021-09-06 LAB — COMPREHENSIVE METABOLIC PANEL
ALT: 23 U/L (ref 0–44)
AST: 45 U/L — ABNORMAL HIGH (ref 15–41)
Albumin: 3.9 g/dL (ref 3.5–5.0)
Alkaline Phosphatase: 64 U/L (ref 38–126)
Anion gap: 10 (ref 5–15)
BUN: 6 mg/dL — ABNORMAL LOW (ref 8–23)
CO2: 26 mmol/L (ref 22–32)
Calcium: 9.2 mg/dL (ref 8.9–10.3)
Chloride: 103 mmol/L (ref 98–111)
Creatinine, Ser: 0.75 mg/dL (ref 0.61–1.24)
GFR, Estimated: >60 mL/min (ref >60)
Glucose, Bld: 116 mg/dL — ABNORMAL HIGH (ref 70–99)
Potassium: 4.3 mmol/L (ref 3.5–5.1)
Sodium: 139 mmol/L (ref 135–145)
Total Bilirubin: 0.7 mg/dL (ref 0.3–1.2)
Total Protein: 6.8 g/dL (ref 6.5–8.1)

## 2021-09-06 LAB — ETHANOL: Alcohol, Ethyl (B): 331 mg/dL (ref <10)

## 2021-09-06 MED ORDER — HALOPERIDOL LACTATE 5 MG/ML IJ SOLN
2.0000 mg | Freq: Once | INTRAMUSCULAR | Status: AC
  Administered 2021-09-06: 2 mg via INTRAVENOUS
  Filled 2021-09-06: qty 1

## 2021-09-06 MED ORDER — LACTATED RINGERS IV BOLUS
1000.0000 mL | Freq: Once | INTRAVENOUS | Status: AC
  Administered 2021-09-06: 1000 mL via INTRAVENOUS

## 2021-09-06 NOTE — ED Triage Notes (Addendum)
Pt to triage via [facility].  Neighbors found pt lying on concrete steps outside his house with a liquor bottle.  Abrasion to L forearm  Pt denies pain.  Pt intoxicated, loud, and cursing.  States he is Merry Proud.  C-collar in place on arrival.

## 2021-09-06 NOTE — ED Notes (Signed)
Date and time results received: 09/06/21 2031  (use smartphrase ".now" to insert current time)  Test: etoh Critical Value: 331  Name of Provider Notified: Dr. Regenia Skeeter  Orders Received? Or Actions Taken?:  n/a

## 2021-09-06 NOTE — ED Provider Notes (Signed)
Charles Bates   CSN: [id number] Arrival date & time: 09/06/21  1718  LEVEL 5 CAVEAT - ALCOHOL INTOXICATION  History No chief complaint on file.   Charles Bates is a 65 y.o. male.  HPI 65 year old male presents with alcohol intoxication. History is limited due to this. He apparently was found on the ground after a presumed fall.  Patient does endorse drinking a lot.  He has otherwise been somewhat disruptive and is loud and cursing.  C-collar was placed by nursing staff.  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   Constipation 09/25/2014    Past Surgical History:  Procedure Laterality Date   COLONOSCOPY     HERNIA REPAIR         Family History  Problem Relation Age of Onset   Varicose Veins Mother    Diabetes Mother    Hypertension Mother    Heart disease Mother    Colon cancer Neg Hx    Esophageal cancer Neg Hx    Stomach cancer Neg Hx    Rectal cancer Neg Hx     Social History   Tobacco Use   Smoking status: Every Day    Packs/day: 0.50    Years: 45.00    Pack years: 22.50    Types: Cigarettes   Smokeless tobacco: Never  Substance Use Topics   Alcohol use: No    Alcohol/week: 0.0 standard drinks   Drug use: No    Home Medications Prior to Admission medications   Medication Sig Start Date End Date Taking? Authorizing Provider  rosuvastatin (CRESTOR) 10 MG tablet Take 1 tablet (10 mg total) by mouth daily. Patient not taking: Reported on 03/24/2017 01/02/16   Biagio Borg, MD    Allergies    Patient has no known allergies.  Review of Systems   Review of Systems  Unable to perform ROS: Mental status change   Physical Exam Updated Vital Signs BP 126/65 (BP Location: Left  Arm)   Pulse 91   Temp 98.2 F (36.8 C)   Resp 18   SpO2 98%   Physical Exam Vitals and nursing Bates reviewed.  Constitutional:      General: He is not in acute distress.    Appearance: He is well-developed. He is not ill-appearing or diaphoretic.     Interventions: Cervical collar in place.  HENT:     Head: Normocephalic and atraumatic.     Right Ear: External ear normal.     Left Ear: External ear normal.     Nose: Nose normal.  Eyes:     General:        Right eye: No discharge.        Left eye: No discharge.  Cardiovascular:     Rate and Rhythm: Regular rhythm. Tachycardia present.     Heart sounds: Normal heart sounds.  Pulmonary:     Effort: Pulmonary effort is normal.     Breath sounds: Normal breath sounds.  Abdominal:     Palpations: Abdomen is soft.     Tenderness: There is no abdominal tenderness.  Musculoskeletal:     Cervical back: Neck supple.  Skin:    General: Skin is warm and dry.  Comments: Small abrasion to left forearm. No tenderness/swelling  Neurological:     Mental Status: He is alert.     Comments: Intoxicated. Normal strength/movement of all 4 extremities  Psychiatric:        Mood and Affect: Mood is not anxious.    ED Results / Procedures / Treatments   Labs (all labs ordered are listed, but only abnormal results are displayed) Labs Reviewed  ETHANOL - Abnormal; Notable for the following components:      Result Value   Alcohol, Ethyl (B) 331 (*)    All other components within normal limits  CBC WITH DIFFERENTIAL/PLATELET - Abnormal; Notable for the following components:   MCV 104.5 (*)    Platelets 143 (*)    All other components within normal limits  COMPREHENSIVE METABOLIC PANEL - Abnormal; Notable for the following components:   Glucose, Bld 116 (*)    BUN 6 (*)    AST 45 (*)    All other components within normal limits    EKG None  Radiology CT HEAD WO CONTRAST (5MM)  Result Date: 09/06/2021 CLINICAL DATA:  Found  down, ETOH EXAM: CT HEAD WITHOUT CONTRAST CT CERVICAL SPINE WITHOUT CONTRAST TECHNIQUE: Multidetector CT imaging of the head and cervical spine was performed following the standard protocol without intravenous contrast. Multiplanar CT image reconstructions of the cervical spine were also generated. COMPARISON:  None. FINDINGS: CT HEAD FINDINGS Brain: No evidence of acute infarction, hemorrhage, hydrocephalus, extra-axial collection or mass lesion/mass effect. Mild cortical atrophy. Subcortical white matter and periventricular small vessel ischemic changes. Vascular: Intracranial atherosclerosis. Skull: Normal. Negative for fracture or focal lesion. Sinuses/Orbits: The visualized paranasal sinuses are essentially clear. The mastoid air cells are unopacified. Other: None. CT CERVICAL SPINE FINDINGS Alignment: Exaggerated mid/lower cervical lordosis. Skull base and vertebrae: No acute fracture. No primary bone lesion or focal pathologic process. Soft tissues and spinal canal: No prevertebral fluid or swelling. No visible canal hematoma. Disc levels: Moderate degenerative changes of the mid cervical spine. Spinal canal is patent. Upper chest: Visualized lung apices are notable for mild centrilobular and paraseptal emphysematous changes. Other: Visualized thyroid is unremarkable. IMPRESSION: No evidence of acute intracranial abnormality. Mild atrophy with small vessel ischemic changes. No evidence of traumatic injury to the cervical spine. Moderate degenerative changes. Electronically Signed   By: Julian Hy M.D.   On: 09/06/2021 20:44   CT Cervical Spine Wo Contrast  Result Date: 09/06/2021 CLINICAL DATA:  Found down, ETOH EXAM: CT HEAD WITHOUT CONTRAST CT CERVICAL SPINE WITHOUT CONTRAST TECHNIQUE: Multidetector CT imaging of the head and cervical spine was performed following the standard protocol without intravenous contrast. Multiplanar CT image reconstructions of the cervical spine were also generated.  COMPARISON:  None. FINDINGS: CT HEAD FINDINGS Brain: No evidence of acute infarction, hemorrhage, hydrocephalus, extra-axial collection or mass lesion/mass effect. Mild cortical atrophy. Subcortical white matter and periventricular small vessel ischemic changes. Vascular: Intracranial atherosclerosis. Skull: Normal. Negative for fracture or focal lesion. Sinuses/Orbits: The visualized paranasal sinuses are essentially clear. The mastoid air cells are unopacified. Other: None. CT CERVICAL SPINE FINDINGS Alignment: Exaggerated mid/lower cervical lordosis. Skull base and vertebrae: No acute fracture. No primary bone lesion or focal pathologic process. Soft tissues and spinal canal: No prevertebral fluid or swelling. No visible canal hematoma. Disc levels: Moderate degenerative changes of the mid cervical spine. Spinal canal is patent. Upper chest: Visualized lung apices are notable for mild centrilobular and paraseptal emphysematous changes. Other: Visualized thyroid is unremarkable. IMPRESSION: No evidence of  acute intracranial abnormality. Mild atrophy with small vessel ischemic changes. No evidence of traumatic injury to the cervical spine. Moderate degenerative changes. Electronically Signed   By: Julian Hy M.D.   On: 09/06/2021 20:44    Procedures Procedures   Medications Ordered in ED Medications  lactated ringers bolus 1,000 mL (0 mLs Intravenous Stopped 09/06/21 2334)  haloperidol lactate (HALDOL) injection 2 mg (2 mg Intravenous Given 09/06/21 1959)    ED Course  I have reviewed the triage vital signs and the nursing notes.  Pertinent labs & imaging results that were available during my care of the patient were reviewed by me and considered in my medical decision making (see chart for details).    MDM Rules/Calculators/A&P                           Patient presents with alcohol intoxication.  There is no clear injury besides a small abrasion to his forearm.  He was having agitation  and not cooperative with work-up so he had to be given a small dose of Haldol.  When he is sober I think he will be stable for discharge. Care to Dr. Randal Buba.  Final Clinical Impression(s) / ED Diagnoses Final diagnoses:  Alcoholic intoxication without complication [facility])    Rx / DC Orders ED Discharge Orders     None        Sherwood Gambler, MD 09/06/21 2336

## 2021-09-06 NOTE — ED Notes (Signed)
Procedures

## 2021-09-07 NOTE — ED Notes (Signed)
Pt ambulated to BR without assistance

## 2022-04-27 DEATH — deceased

## 2023-04-07 ENCOUNTER — Encounter: Payer: Self-pay | Admitting: Gastroenterology
